# Patient Record
Sex: Female | Born: 1980 | Race: Black or African American | Hispanic: No | Marital: Married | Smoking: Never smoker
Health system: Southern US, Community
[De-identification: ages and names within clinical notes are randomized; demographics above are authoritative.]

## PROBLEM LIST (undated history)

## (undated) DIAGNOSIS — E049 Nontoxic goiter, unspecified: Secondary | ICD-10-CM

## (undated) DIAGNOSIS — Z8249 Family history of ischemic heart disease and other diseases of the circulatory system: Secondary | ICD-10-CM

## (undated) DIAGNOSIS — B379 Candidiasis, unspecified: Secondary | ICD-10-CM

## (undated) DIAGNOSIS — O411290 Chorioamnionitis, unspecified trimester, not applicable or unspecified: Secondary | ICD-10-CM

## (undated) DIAGNOSIS — Z862 Personal history of diseases of the blood and blood-forming organs and certain disorders involving the immune mechanism: Secondary | ICD-10-CM

## (undated) DIAGNOSIS — E010 Iodine-deficiency related diffuse (endemic) goiter: Secondary | ICD-10-CM

## (undated) HISTORY — DX: Family history of ischemic heart disease and other diseases of the circulatory system: Z82.49

## (undated) HISTORY — DX: Candidiasis, unspecified: B37.9

## (undated) HISTORY — DX: Nontoxic goiter, unspecified: E04.9

## (undated) HISTORY — DX: Personal history of diseases of the blood and blood-forming organs and certain disorders involving the immune mechanism: Z86.2

## (undated) HISTORY — DX: Iodine-deficiency related diffuse (endemic) goiter: E01.0

## (undated) HISTORY — DX: Chorioamnionitis, unspecified trimester, not applicable or unspecified: O41.1290

---

## 2005-08-26 ENCOUNTER — Other Ambulatory Visit: Admission: RE | Admit: 2005-08-26 | Discharge: 2005-08-26 | Payer: Self-pay | Admitting: Family Medicine

## 2007-07-11 ENCOUNTER — Other Ambulatory Visit: Admission: RE | Admit: 2007-07-11 | Discharge: 2007-07-11 | Payer: Self-pay | Admitting: Family Medicine

## 2008-04-08 ENCOUNTER — Inpatient Hospital Stay (HOSPITAL_COMMUNITY): Admission: AD | Admit: 2008-04-08 | Discharge: 2008-04-14 | Payer: Self-pay | Admitting: Obstetrics and Gynecology

## 2008-04-11 ENCOUNTER — Encounter (INDEPENDENT_AMBULATORY_CARE_PROVIDER_SITE_OTHER): Payer: Self-pay | Admitting: Obstetrics and Gynecology

## 2010-12-14 ENCOUNTER — Other Ambulatory Visit: Payer: Self-pay | Admitting: Obstetrics and Gynecology

## 2010-12-14 DIAGNOSIS — E079 Disorder of thyroid, unspecified: Secondary | ICD-10-CM

## 2010-12-16 ENCOUNTER — Ambulatory Visit
Admission: RE | Admit: 2010-12-16 | Discharge: 2010-12-16 | Disposition: A | Discharge status: Home / Self Care | Payer: BC Managed Care – PPO | Source: Ambulatory Visit | Attending: Obstetrics and Gynecology | Admitting: Obstetrics and Gynecology

## 2010-12-16 DIAGNOSIS — E079 Disorder of thyroid, unspecified: Secondary | ICD-10-CM

## 2011-01-19 NOTE — H&P (Signed)
Frederick, Pamela NO.:  [id number]   MEDICAL RECORD NO.:  [id number]        PATIENT TYPE:  WINP   LOCATION:                                FAC[facility]   PHYSICIAN:  Crist Fat. Rivard, M.D. DATE OF BIRTH:  [birth year]   DATE OF ADMISSION:  04/08/2008  DATE OF DISCHARGE:                              HISTORY & PHYSICAL   [name] is a 30 year old married black female, gravida at 40 weeks  and 4 days who presented as a work in at the office today for leaking of  fluid since 3 a.m.  On assessment, she was indeed ruptured. She was  having about two contractions an hour or less and good fetal movement.  No vaginal bleeding. She denied any PIH or urinary tract infection signs  or symptoms, nausea, vomiting or diarrhea, shortness of breath, fever or  cough or other complaints.   HISTORY:  1. Remarkable for small frame.  2. Platelets at her new OB 135.  3. GBS negative.  4. Questionable OP versus OT presentation.   PRENATAL LABS:  The patient's blood type is B+, Rh antibody screen  negative, sickle cell negative, hemoglobin on September 14, 2007 is 13,  hematocrit 35.9, platelets were 135, sickle cell trait negative, RPR  nonreactive, rubella titer immune, hepatitis surface antigen negative,  HIV non-reactive. She had a pap smear in 2008, was within normal limits,  but I cannot read on here exactly the month.  Recent chlamydia cultures  were negative.  As I noted, group beta strep is negative.  Her Glucola  was within normal limits.  She can have platelets redrawn at 27 and 2/7  weeks and they were 135, RPR was non-reactive.  Actually her one hour  GCT was elevated and she was sent for a 3-hour GCT.  Three hour GCT was  within normal limits.  Her fasting was 87 and her one hour was 170, her  two hour was 140 and her three hour was 130.  Platelets were 126 at 30  weeks and then she has one more platelet check and that was 126 around  33 weeks.   OBSTETRICAL  HISTORY:  The patient is a gravida.   PAST MEDICAL HISTORY:  The patient reports menarche at 30 years of age.  Has 28-30 day cycles. Flow is 5-7 days with some moderate cramping, but  no other abnormalities. Her last menstrual period was June 29, 2007  giving her an [facility] of April 04, 2008.  She reports normal childhood  illnesses including varicella and vaccines, occasional yeast infections.  She denies any surgical history.  Genetic history is unremarkable.   FAMILY HISTORY:  Mother with chronic hypertension, father tuberculosis,  mother is deceased of unknown cause. She has a brother who has a  nicotine and alcohol addiction.   SOCIAL HISTORY:  She is a married, African female. She has a Tree surgeon. The father of the baby's name is [name].  The patient has  had 16+ years of education and is a full time Runner, broadcasting/film/video. The father of the  baby has had 14 years  of education and is a Archivist.   ALLERGIES:  She denies medication or latex allergies or other  sensitivities.  She denies alcohol or illicit drug use, has had second  hand smoke exposure. She is taking a prenatal vitamin.   HISTORY OF PRESENT PREGNANCY:  She entered care at approximately 11  weeks. She came for her new OB interview about 8 and 5/7 weeks and that  was on December 23, then was transferred to her new OB workup on January  8 at approximately 11 weeks. I guess it says here her pap was done in  October of 2008 so it is coming up due post partum.  She declined first  trimester screen and plan was made for her to return in 1-2 weeks for an  ultrasound for dating.  So, she returned at approximately 13 and 1/7  weeks.  When she returned, she did not have any ultrasound scheduled for  the return OB and she declined rescheduling. She also declined Kleb  screen and first trimester screen.  She was prescribed Kenalog for a  rash on her left calf and was taking generic natel prenatal vitamin  prescription. She  returned at 18 weeks for her anatomy ultrasound  showing single intrauterine pregnancy within consistent dates. Cervical  length was 3.88 cm, regular rate and rhythm, posterior placenta, normal  fluid. All anatomy was seen and no anomalies observed at the time. The  patient did discuss taking childbirth classes. She is a Editor, commissioning.  She returned at 22 and 4/7 weeks and was doing well with the exception  of some back pain especially after a long day at work. She returned at  27 and 2/7 weeks and had her Glucola which was elevated, but three hour  was within normal limits. Plan was made for an ultrasound on her next  visit secondary to poor weight gain. She started out the pregnancy at  127 pounds and by 27 weeks was at 131.  She was without other complaints  and fetal counts were discussed. Platelets at that time were 136.  RPR  was non-reactive. She was then seen at 29 and 1/7 weeks and had an  ultrasound vertex  grade I placenta.  Estimated fetal weight 2 pounds  and 14 ounces which was 48th percentile, normal fluid, AFI 16.6,  cervical length was 4.35 cm.  At approximately 31 weeks, the patient did  have 2+ glucosuria and her blood sugar was 159, diet was reviewed.  She  then had her platelets checked that day which was May 28 and they were  126.  She was seen at 33 weeks and continued doing well and was without  complaints.  At around 35 weeks, still without any complaints. Her GBS  and GC, chlamydia cultures were done and her cervical examination was  closed 50% vertex and -3.  As noted, GBS and GC, chlamydia cultures was  negative. She did then voice at 36 weeks that she desired CNM care. On  Leopold's, fetus was in suspected OP presentation.  Discussed pelvic  tilts at that time. Pregnancy has continued to progress without any  other complications until she presented today with the complaints of the  leakage of fluid.  Today, her blood pressure was 130/76, she is without  edema.  She was measuring 39+ weeks at 40 and 4/[redacted] weeks gestation. Vertex  presentation, OP versus OT with fetal back on the patient's right side.  Fetal heart rate was in the 150s  and fetal movement was noted during her  examination.   PHYSICAL EXAMINATION:  GENERAL:  In no acute distress. Alert and  oriented x3 and pleasant. She is well dressed and she does not have a  slight accent.  HEENT: Is grossly intact and within normal limits.  CARDIOVASCULAR: Regular rate and rhythm. Without murmur.  LUNGS:  Clear to auscultation bilaterally.  ABDOMEN: Soft and nontender. It is gravid. No rebound, no guarding.  PELVIC:  Sterile spec examination with negative pooling, negative  Nitrazine, but positive fern. Her cervix is closed 50% and -2 station.  She had a scant white discharge.  Dr. Estanislado Pandy did also review the slide  and agreed with positive fern.  EXTREMITIES: Are within normal limits with no edema. No clonus. DTRs are  2+.   IMPRESSION:  1. Intrauterine pregnancy at 40 and 4/7 weeks.  2. Premature rupture of membranes.  3. Thrombocytopenia.  4. Group beta strep is negative.   PLAN:  1. Consulted with Dr. Estanislado Pandy and discussed expected management versus      induction of labor and then reviewed options, both options, risks,      benefits and alternatives with the patient and the patient desires      to proceed with expected management at present. However, Dr. Estanislado Pandy      did want the patient to be directly admitted for some fetal      monitoring.  2. Admit to birthing suites with routine L&D orders with Dr. Estanislado Pandy as      attending physician.  3. At present, will do expected management and will proceed with      pitocin IV per protocol p.r.n. for induction or augmentation if      spontaneous labor ensues. Also, will rediscuss with Dr. Estanislado Pandy if      Cervidil could be an option for some ripening.      Candice [place], [place]      ______________________________  Crist Fat  Rivard, M.D.    CHS/MEDQ  D:  04/08/2008  T:  04/08/2008  Job:  [id number]

## 2011-01-19 NOTE — Op Note (Signed)
[name], [name]            ACCOUNT NO.:  [id number]   MEDICAL RECORD NO.:  [id number]          PATIENT TYPE:  INP   LOCATION:  9125                          [facility]ILITY:  [facility]   PHYSICIAN:  Crist Fat. Rivard, M.D. DATE OF BIRTH:  26-Jul-1981   DATE OF PROCEDURE:  04/11/2008  DATE OF DISCHARGE:                               OPERATIVE REPORT   PREOPERATIVE DIAGNOSIS:  Intrauterine pregnancy at 41 weeks with  chorioamnionitis and failure to progress.   POSTOPERATIVE DIAGNOSIS:  Intrauterine pregnancy at 41 weeks with  chorioamnionitis and failure to progress.   ANESTHESIA:  Epidural, Donald T. Pamalee Leyden, MD   PROCEDURE:  Primary low-transverse cesarean section.   SURGEON:  Crist Fat. Rivard, MD   ASSISTANT:  Larna Daughters, Certified Nurse Midwife.   ESTIMATED BLOOD LOSS:  700 mL.   PROCEDURE:  After being informed of the planned procedure with possible  complications including bleeding, infection, injury to bowels, bladder,  or ureters.  Informed consent was obtained.  The patient was taken to OR  #1 and preexisting epidural anesthesia was reinforced.  She was placed  in the dorsal decubitus position, pelvis tilted to the left, prepped and  draped in a sterile fashion.  A Foley catheter was already in her  bladder.   After assessing adequate level of anesthesia, we infiltrate the  suprapubic area with 20 mL of Marcaine 0.25 and perform a Pfannenstiel  incision, which was brought down sharply to the fascia.  The fascia was  incised in a low transverse fashion.  Linea alba was dissected and  peritoneum was entered bluntly.  Visceral peritoneum was entered in a  low transverse fashion allowing [place] to safely retract bladder by  developing a bladder flap.  Alexis retractor was inserted easily.  We  then performed the uterine incision in a low-transverse fashion first  with knife and then extended bluntly.  Amniotic fluid was clear and  scars.  We assist the birth of a female  infant at 1:35 a.m. in ROT  presentation.  Mouth and nose were suctioned.  Nuchal cord x1 was  reduced.  Baby was delivered.  Cord was clamped with 2 Kelly clamps and  sectioned and the baby was given to the neonatologist present in the  room.   We await spontaneous evacuation of the placenta, which was complete.  Cord has three vessel and uterine revision was negative.  We do note a  small uterine atony after evacuation of the placenta, which responds  well to a perfusion of Pitocin 40 units and 1000 mL as well as 1 dose of  Methergine IM 0.2 mg.   We then proceed with closure of the myometrium in 2 layers, first with a  running lock suture of 0 Vicryl and then with a Lembert suture of 0  Vicryl imbricating the first one.  Hemostasis was completed on the right  angle with a figure-of-eight stitch of 0 Vicryl.  Both paracolic gutters  were cleaned.  Both tubes and ovaries assessed normal and the pelvis was  profusely irrigated with warm saline to reveal a satisfactory  hemostasis.  Under fascia, hemostasis was completed with cautery and the fascia was  closed with two running sutures of 1 Vicryl meeting midline.  Incision  was irrigated with warm saline.  Hemostasis was completed with cautery  and the skin was closed with a subcuticular suture of 3-0 Monocryl and  Steri-Strips.   Instrument and sponge count was complete x2.  Estimated blood loss was  700 mL.  The procedure was very well tolerated by the patient who was  taken to recovery room in a well and stable condition.   Little boy unnamed at this time, was born at 1:35 a.m., received an  Apgar of 8 at 1 minute and 9 at 5 minutes and weighed 6 pounds 5 ounces.   SPECIMEN:  Placenta sent to cord blood donation and then to pathology  for chorioamnionitis.      Crist Fat Rivard, M.D.  Electronically Signed     SAR/MEDQ  D:  04/11/2008  T:  04/11/2008  Job:  [id number]

## 2011-01-19 NOTE — Discharge Summary (Signed)
NAME:  [name], [name]            ACCOUNT NO.:  [id number]   MEDICAL RECORD NO.:  [id number]          PATIENT TYPE:  INP   LOCATION:  9125                          [facility]   PHYSICIAN:  Naima A. Dillard, M.D. DATE OF BIRTH:  30-Mar-1981   DATE OF ADMISSION:  04/08/2008  DATE OF DISCHARGE:  04/14/2008                               DISCHARGE SUMMARY   ADMITTING PHYSICIAN:  Osborn Coho, MD   ADMISSION DIAGNOSES:  1. Intrauterine pregnancy at 40 and 4/7 weeks.  2. Premature rupture of  membranes.  3. Thrombocytopenia.  4. Group B Streptococcus negative.   DISCHARGE DIAGNOSES:  1. Intrauterine pregnancy at 40 and 4/7 weeks.  2. Premature rupture of  membranes.  3. Thrombocytopenia.  4. Group B Streptococcus negative.  5. Chorioamnionitis.  6. Failure to progress.   HOSPITAL PROCEDURES:  1. Electronic fetal monitoring.  2. Pitocin augmentation of labor.  3. Internal fetal monitoring.  4. Amniotomy of forebag.  5. Epidural anesthesia.  6. Primary low-transverse cesarean section.   HOSPITAL COURSE:  The patient was admitted with ruptured membranes.  With her cervix closed and 50%, she elected to proceed expectantly, but  the next day, decided to proceed with Pitocin augmentation of labor.  She took a long time to achieve adequate contractions and continued on  Pitocin through the next day.  IUPC was inserted.  Amniotomy of forebag  was done.  She progressed as far as 4 to 5 cm, but then developed fever,  fetal tachycardia, and maternal tachycardia and in light of those  situations with the failure to progress, a decision was made to proceed  with primary low-transverse cesarean section under epidural anesthesia  by Dr. Estanislado Pandy.  She was delivered of a female infant weighing 6 pounds 5  ounces, Apgars 8 and 9.  She went to recovery and then to mother baby  unit, where she received routine care.  On postop day #1, she was doing  well, breast-feeding, ambulating, and  beginning to tolerate a diet.  On  postop day #2, she continued to do well.  Baby lost some weight, so she  began pumping her breasts in addition for breast-feeding.  On postop day  #3, she was ready to go home.  Baby and mother were both doing well.  She was breast-feeding well.  Vital signs were stable.  She was  afebrile.  Chest was clear.  Heart was regular rate and rhythm.  Abdomen  soft and appropriately tender.  Incision was clean, dry, and intact.  Lochia was normal.  Extremities within normal limits.  She was deemed to  have received full benefit of her hospital stay and was discharged home.   DISCHARGE MEDICATIONS:  Motrin 600 mg p.o. q.6 h. p.r.n., Darvocet 1 to  2 p.o. q.4 h. p.r.n.   DISCHARGE LABS:  White blood cell count 6.1, hemoglobin 9.7, and  platelets 92,000.   DISCHARGE INSTRUCTIONS:  Per [facility] handout.  Discharge followup in 6 weeks  or p.r.n.   CONDITION ON DISCHARGE:  Good.      Marie L. Williams, C.N.M.  ______________________________  Pierre Bali Normand Sloop, M.D.    MLW/MEDQ  D:  04/14/2008  T:  04/14/2008  Job:  [id number]

## 2011-06-04 LAB — DIFFERENTIAL
Basophils Relative: 0
Eosinophils Absolute: 0.2
Eosinophils Relative: 4
Lymphs Abs: 1
Monocytes Absolute: 0.6
Monocytes Relative: 10

## 2011-06-04 LAB — CBC
HCT: 28.4 — ABNORMAL LOW
HCT: 37.7
HCT: 39
Hemoglobin: 12.9
Hemoglobin: 13.2
MCHC: 33.8
MCHC: 34
MCV: 94.2
MCV: 94.7
MCV: 94.9
RBC: 2.99 — ABNORMAL LOW
RBC: 4
RBC: 4.11
RDW: 12.8
WBC: 5.1
WBC: 6.1

## 2011-11-24 DIAGNOSIS — E010 Iodine-deficiency related diffuse (endemic) goiter: Secondary | ICD-10-CM | POA: Insufficient documentation

## 2011-12-17 ENCOUNTER — Ambulatory Visit: Payer: Self-pay | Admitting: Obstetrics and Gynecology

## 2012-01-12 ENCOUNTER — Ambulatory Visit: Payer: Self-pay | Admitting: Obstetrics and Gynecology

## 2012-02-17 ENCOUNTER — Ambulatory Visit: Payer: Self-pay | Admitting: Obstetrics and Gynecology

## 2012-03-13 ENCOUNTER — Ambulatory Visit (INDEPENDENT_AMBULATORY_CARE_PROVIDER_SITE_OTHER): Payer: BC Managed Care – PPO | Admitting: Obstetrics and Gynecology

## 2012-03-13 ENCOUNTER — Encounter: Payer: Self-pay | Admitting: Obstetrics and Gynecology

## 2012-03-13 VITALS — BP 98/68 | Temp 98.7°F | Resp 16 | Ht 60.0 in | Wt 145.0 lb

## 2012-03-13 DIAGNOSIS — Z0000 Encounter for general adult medical examination without abnormal findings: Secondary | ICD-10-CM

## 2012-03-13 NOTE — Progress Notes (Signed)
Last Pap: 12/14/2010 WNL: Yes Regular Periods:yes Contraception: Yes  Monthly Breast exam:yes Tetanus<17yrs:yes Nl.Bladder Function:yes Daily BMs:yes Healthy Diet:yes Calcium:yes Mammogram:no Date of Mammogram: N/A Exercise:yes Have often Exercise: 4 times weekly/walks Seatbelt: yes Abuse at home: no Stressful work:no Sigmoid-colonoscopy: Never had Bone Density: No PCP: Dr. Hyacinth Meeker Change in PMH: None  Change in [facility]: None  *Pt without complaints today *[place] a pharmacy [place] BP 98/68  Temp 98.7 F (37.1 C)  Resp 16  Ht 5' (1.524 m)  Wt 145 lb (65.772 kg)  BMI 28.32 kg/m2  LMP 03/03/2012 Pt without complaints Physical Examination: General appearance - alert, well appearing, and in no distress Mental status - normal mood, behavior, speech, dress, motor activity, and thought processes Neck - supple, no significant adenopathy, thyroid exam: thyroid is normal in size without nodules or tenderness Chest - clear to auscultation, no wheezes, rales or rhonchi, symmetric air entry Heart - normal rate and regular rhythm Abdomen - soft, nontender, nondistended, no masses or organomegaly Breasts - breasts appear normal, no suspicious masses, no skin or nipple changes or axillary nodes Pelvic - normal external genitalia, vulva, vagina, cervix, uterus and adnexa Rectal - normal rectal, no masses, rectal exam not indicated Back exam - full range of motion, no tenderness, palpable spasm or pain on motion Neurological - alert, oriented, normal speech, no focal findings or movement disorder noted Musculoskeletal - no joint tenderness, deformity or swelling Extremities - no edema, redness or tenderness in the calves or thighs Skin - normal coloration and turgor, no rashes, no suspicious skin lesions noted Routine exam Pap sent yes Mammogram due no pt using ocps for [facility].  she gets them [place]

## 2012-03-13 NOTE — Addendum Note (Signed)
Addended by: Rolla Plate on: 03/13/2012 03:56 PM   Modules accepted: Orders

## 2013-03-22 ENCOUNTER — Other Ambulatory Visit: Payer: Self-pay | Admitting: Obstetrics and Gynecology

## 2013-03-22 DIAGNOSIS — N63 Unspecified lump in unspecified breast: Secondary | ICD-10-CM

## 2013-04-18 ENCOUNTER — Ambulatory Visit
Admission: RE | Admit: 2013-04-18 | Discharge: 2013-04-18 | Disposition: A | Discharge status: Home / Self Care | Payer: Self-pay | Source: Ambulatory Visit | Attending: Obstetrics and Gynecology | Admitting: Obstetrics and Gynecology

## 2013-04-18 ENCOUNTER — Other Ambulatory Visit: Payer: Self-pay | Admitting: Obstetrics and Gynecology

## 2013-04-18 DIAGNOSIS — N63 Unspecified lump in unspecified breast: Secondary | ICD-10-CM

## 2013-04-25 ENCOUNTER — Other Ambulatory Visit: Payer: Self-pay | Admitting: Obstetrics and Gynecology

## 2013-04-25 DIAGNOSIS — N63 Unspecified lump in unspecified breast: Secondary | ICD-10-CM

## 2013-04-30 ENCOUNTER — Ambulatory Visit
Admission: RE | Admit: 2013-04-30 | Discharge: 2013-04-30 | Disposition: A | Discharge status: Home / Self Care | Payer: BC Managed Care – PPO | Source: Ambulatory Visit | Attending: Obstetrics and Gynecology | Admitting: Obstetrics and Gynecology

## 2013-04-30 ENCOUNTER — Other Ambulatory Visit: Payer: Self-pay | Admitting: Obstetrics and Gynecology

## 2013-04-30 DIAGNOSIS — N63 Unspecified lump in unspecified breast: Secondary | ICD-10-CM

## 2013-11-28 ENCOUNTER — Encounter: Payer: Self-pay | Admitting: *Deleted

## 2014-02-14 ENCOUNTER — Inpatient Hospital Stay (HOSPITAL_COMMUNITY)
Admission: AD | Admit: 2014-02-14 | Discharge: 2014-02-15 | Disposition: A | Discharge status: Home / Self Care | Payer: BC Managed Care – PPO | Source: Ambulatory Visit | Attending: Obstetrics and Gynecology | Admitting: Obstetrics and Gynecology

## 2014-02-14 DIAGNOSIS — Z8249 Family history of ischemic heart disease and other diseases of the circulatory system: Secondary | ICD-10-CM | POA: Insufficient documentation

## 2014-02-14 DIAGNOSIS — O479 False labor, unspecified: Secondary | ICD-10-CM | POA: Insufficient documentation

## 2014-02-14 DIAGNOSIS — O34219 Maternal care for unspecified type scar from previous cesarean delivery: Secondary | ICD-10-CM | POA: Insufficient documentation

## 2014-02-15 ENCOUNTER — Encounter (HOSPITAL_COMMUNITY): Payer: Self-pay | Admitting: *Deleted

## 2014-02-15 MED ORDER — ZOLPIDEM TARTRATE 5 MG PO TABS
10.0000 mg | ORAL_TABLET | Freq: Once | ORAL | Status: AC
  Administered 2014-02-15: 10 mg via ORAL
  Filled 2014-02-15: qty 2

## 2014-02-15 MED ORDER — ZOLPIDEM TARTRATE 10 MG PO TABS: 10.0000 mg | ORAL_TABLET | Freq: Once | ORAL | Status: DC

## 2014-02-15 NOTE — MAU Note (Signed)
Pt reports regular uc's for the past 3 hours. Pt states that the contractions are about every 5 min when she is up walking, every 10 min when she is resting. Deneis bleeding or leaking. +FM

## 2014-02-15 NOTE — MAU Provider Note (Signed)
  History     CSN: [id number]  Arrival date and time: 02/14/14 2358   None     Chief Complaint  Patient presents with  . Contractions   HPI Comments: Pt is a 33yo G2P1 at 6850w3d arrives after calling CNM w report of more regular painful ctx in the last few hours. Denies any VB or LOF, +FM. Desires VBAC     Past Medical History  Diagnosis Date  . Yeast infection   . H/O thrombocytopenia   . Chorioamnionitis   . Enlarged thyroid   . Thyromegaly   . FH: HTN (hypertension)     Past Surgical History  Procedure Laterality Date  . Cesarean section      Family History  Problem Relation Age of Onset  . Hypertension Mother     History  Substance Use Topics  . Smoking status: Never Smoker   . Smokeless tobacco: Never Used  . Alcohol Use: No    Allergies: No Known Allergies  Prescriptions prior to admission  Medication Sig Dispense Refill  . Multiple Vitamins-Minerals (MULTIVITAMIN WITH MINERALS) tablet Take 1 tablet by mouth daily.      Marland Kitchen. levonorgestrel-ethinyl estradiol (LEVORA 0.15/30, 28,) 0.15-30 MG-MCG tablet Take 1 tablet by mouth daily.        Review of Systems  All other systems reviewed and are negative.  Physical Exam   Blood pressure 136/71, pulse 89, temperature 98.4 F (36.9 C), temperature source Oral, resp. rate 18.  Physical Exam  Nursing note and vitals reviewed. Constitutional: She is oriented to person, place, and time. She appears well-developed and well-nourished.  HENT:  Head: Normocephalic.  Eyes: Pupils are equal, round, and reactive to light.  Neck: Normal range of motion.  Cardiovascular: Normal rate.   Respiratory: Effort normal.  GI: Soft.  Genitourinary: Vagina normal.  Musculoskeletal: Normal range of motion.  Neurological: She is alert and oriented to person, place, and time. She has normal reflexes.  Skin: Skin is warm and dry.  Psychiatric: She has a normal mood and affect. Her behavior is normal.    MAU Course   Procedures    Assessment and Plan  IUP at 2250w3d FHR cat 1 irreg toco Closed cervix Desires VBAC  Not in labor ambien 10mg  PO given before discharge and RX given rv'd [facility]KC and labor sx's,  F/u routine as scheduled in office Tues or before w labor sx's, ROM, dec FM or VB dc'd home stable cond  Airabella Barley M 02/15/2014, 1:32 AM

## 2014-02-15 NOTE — Discharge Instructions (Signed)
Braxton Hicks Contractions Pregnancy is commonly associated with contractions of the uterus throughout the pregnancy. Towards the end of pregnancy (32 to 34 weeks), these contractions [facility] Willa Rough) can develop more often and may become more forceful. This is not true labor because these contractions do not result in opening (dilatation) and thinning of the cervix. They are sometimes difficult to tell apart from true labor because these contractions can be forceful and people have different pain tolerances. You should not feel embarrassed if you go to the hospital with false labor. Sometimes, the only way to tell if you are in true labor is for your caregiver to follow the changes in the cervix. How to tell the difference between true and false labor:  False labor.  The contractions of false labor are usually shorter, irregular and not as hard as those of true labor.  They are often felt in the front of the lower abdomen and in the groin.  They may leave with walking around or changing positions while lying down.  They get weaker and are shorter lasting as time goes on.  These contractions are usually irregular.  They do not usually become progressively stronger, regular and closer together as with true labor.  True labor.  Contractions in true labor last 30 to 70 seconds, become very regular, usually become more intense, and increase in frequency.  They do not go away with walking.  The discomfort is usually felt in the top of the uterus and spreads to the lower abdomen and low back.  True labor can be determined by your caregiver with an exam. This will show that the cervix is dilating and getting thinner. If there are no prenatal problems or other health problems associated with the pregnancy, it is completely safe to be sent home with false labor and await the onset of true labor. HOME CARE INSTRUCTIONS   Keep up with your usual exercises and instructions.  Take medications as  directed.  Keep your regular prenatal appointment.  Eat and drink lightly if you think you are going into labor.  If BH contractions are making you uncomfortable:  Change your activity position from lying down or resting to walking/walking to resting.  Sit and rest in a tub of warm water.  Drink 2 to 3 glasses of water. Dehydration may cause B-H contractions.  Do slow and deep breathing several times an hour. SEEK IMMEDIATE MEDICAL CARE IF:   Your contractions continue to become stronger, more regular, and closer together.  You have a gushing, burst or leaking of fluid from the vagina.  An oral temperature above 102 F (38.9 C) develops.  You have passage of blood-tinged mucus.  You develop vaginal bleeding.  You develop continuous belly (abdominal) pain.  You have low back pain that you never had before.  You feel the baby's head pushing down causing pelvic pressure.  The baby is not moving as much as it used to. Document Released: 08/23/2005 Document Revised: 11/15/2011 Document Reviewed: 06/04/2013 [facility] Patient Information 2014 [place], [place].  Vaginal Birth After Cesarean Delivery Vaginal birth after cesarean delivery (VBAC) is giving birth vaginally after previously delivering a baby by a cesarean. In the past, if a woman had a cesarean delivery, all births afterwards would be done by cesarean delivery. This is no longer true. It can be safe for the mother to try a vaginal delivery after having a cesarean delivery.  It is important to discuss VBAC with your health care provider early in the  pregnancy so you can understand the risks, benefits, and options. It will give you time to decide what is best in your particular case. The final decision about whether to have a VBAC or repeat cesarean delivery should be between you and your health care provider. Any changes in your health or your baby's health during your pregnancy may make it necessary to change your initial  decision about VBAC.  WOMEN WHO PLAN TO HAVE A VBAC SHOULD CHECK WITH THEIR HEALTH CARE PROVIDER TO BE SURE THAT:  The previous cesarean delivery was done with a low transverse uterine cut (incision) (not a vertical classical incision).   The birth canal is big enough for the baby.   There were no other operations on the uterus.   An electronic fetal monitor (EFM) will be on at all times during labor.   An operating room will be available and ready in case an emergency cesarean delivery is needed.   A health care provider and surgical nursing staff will be available at all times during labor to be ready to do an emergency delivery cesarean if necessary.   An anesthesiologist will be present in case an emergency cesarean delivery is needed.   The nursery is prepared and has adequate personnel and necessary equipment available to care for the baby in case of an emergency cesarean delivery. BENEFITS OF VBAC  Shorter stay in the hospital.   Avoidance of risks associated with cesarean delivery, such as:  Surgical complications, such as opening of the incision or hernia in the incision.  Injury to other organs.  Fever. This can occur if an infection develops after surgery. It can also occur as a reaction to the medicine given to make you numb during the surgery.  Less blood loss and need for blood transfusions.  Lower risk of blood clots and infection.  Shorter recovery.   Decreased risk for having to remove the uterus (hysterectomy).   Decreased risk for the placenta to completely or partially cover the opening of the uterus (placenta previa) with a future pregnancy.   Decrease risk in future labor and delivery. RISKS OF A VBAC  Tearing (rupture) of the uterus. This is occurs in less than 1% of VBACs. The risk of this happening is higher if:  Steps are taken to begin the labor process (induce labor) or stimulate or strengthen contractions (augment labor).    Medicine is used to soften (ripen) the cervix.  Having to remove the uterus (hysterectomy) if it ruptures. VBAC SHOULD NOT BE DONE IF:  The previous cesarean delivery was done with a vertical (classical) or T-shaped incision or you do not know what kind of incision was made.   You had a ruptured uterus.   You have had certain types of surgery on your uterus, such as removal of uterine fibroids. Ask your health care provider about other types of surgeries that prevent you from having a VBAC.  You have certain medical or childbirth (obstetrical) problems.   There are problems with the baby.   You have had two previous cesarean deliveries and no vaginal deliveries. OTHER FACTS TO KNOW ABOUT VBAC:  It is safe to have an epidural anesthetic with VBAC.   It is safe to turn the baby from a breech position (attempt an external cephalic version).   It is safe to try a VBAC with twins.   VBAC may not be successful if your baby weights 8.8 lb (4 kg) or more. However, weight predictions are not  always accurate and should not be used alone to decide if VBAC is right for you.  There is an increased failure rate if the time between the cesarean delivery and VBAC is less than 19 months.   Your health care provider may advise against a VBAC if you have preeclampsia (high blood pressure, protein in the urine, and swelling of face and extremities).   VBAC is often successful if you previously gave birth vaginally.   VBAC is often successful when the labor starts spontaneously before the due date.   Delivering a baby through a VBAC is similar to having a normal spontaneous vaginal delivery. Document Released: 02/13/2007 Document Revised: 06/13/2013 Document Reviewed: 03/22/2013 [facility] Patient Information 2014 [place].  Fetal Movement Counts Patient Name: __________________________________________________ Patient Due Date: ____________________ Performing a fetal movement  count is highly recommended in high-risk pregnancies, but it is good for every pregnant woman to do. Your caregiver may ask you to start counting fetal movements at 28 weeks of the pregnancy. Fetal movements often increase:  After eating a full meal.  After physical activity.  After eating or drinking something sweet or cold.  At rest. Pay attention to when you feel the baby is most active. This will help you notice a pattern of your baby's sleep and wake cycles and what factors contribute to an increase in fetal movement. It is important to perform a fetal movement count at the same time each day when your baby is normally most active.  HOW TO COUNT FETAL MOVEMENTS 1. Find a quiet and comfortable area to sit or lie down on your left side. Lying on your left side provides the best blood and oxygen circulation to your baby. 2. Write down the day and time on a sheet of paper or in a journal. 3. Start counting kicks, flutters, swishes, rolls, or jabs in a 2 hour period. You should feel at least 10 movements within 2 hours. 4. If you do not feel 10 movements in 2 hours, wait 2 3 hours and count again. Look for a change in the pattern or not enough counts in 2 hours. SEEK MEDICAL CARE IF:  You feel less than 10 counts in 2 hours, tried twice.  There is no movement in over an hour.  The pattern is changing or taking longer each day to reach 10 counts in 2 hours.  You feel the baby is not moving as he or she usually does. Date: ____________ Movements: ____________ Start time: ____________ Pamela MartinFinish time: ____________  Date: ____________ Movements: ____________ Start time: ____________ Pamela MartinFinish time: ____________ Date: ____________ Movements: ____________ Start time: ____________ Pamela MartinFinish time: ____________ Date: ____________ Movements: ____________ Start time: ____________ Pamela MartinFinish time: ____________ Date: ____________ Movements: ____________ Start time: ____________ Pamela MartinFinish time: ____________ Date:  ____________ Movements: ____________ Start time: ____________ Pamela MartinFinish time: ____________ Date: ____________ Movements: ____________ Start time: ____________ Pamela MartinFinish time: ____________ Date: ____________ Movements: ____________ Start time: ____________ Pamela MartinFinish time: ____________  Date: ____________ Movements: ____________ Start time: ____________ Pamela MartinFinish time: ____________ Date: ____________ Movements: ____________ Start time: ____________ Pamela MartinFinish time: ____________ Date: ____________ Movements: ____________ Start time: ____________ Pamela MartinFinish time: ____________ Date: ____________ Movements: ____________ Start time: ____________ Pamela MartinFinish time: ____________ Date: ____________ Movements: ____________ Start time: ____________ Pamela MartinFinish time: ____________ Date: ____________ Movements: ____________ Start time: ____________ Pamela MartinFinish time: ____________ Date: ____________ Movements: ____________ Start time: ____________ Pamela MartinFinish time: ____________  Date: ____________ Movements: ____________ Start time: ____________ Pamela MartinFinish time: ____________ Date: ____________ Movements: ____________ Start time: ____________ Pamela MartinFinish time: ____________ Date: ____________ Movements: ____________ Start  time: ____________ [name] time: ____________ Date: ____________ Movements: ____________ Start time: ____________ [name] time: ____________ Date: ____________ Movements: ____________ Start time: ____________ [name] time: ____________ Date: ____________ Movements: ____________ Start time: ____________ [name] time: ____________ Date: ____________ Movements: ____________ Start time: ____________ [name] time: ____________  Date: ____________ Movements: ____________ Start time: ____________ [name] time: ____________ Date: ____________ Movements: ____________ Start time: ____________ [name] time: ____________ Date: ____________ Movements: ____________ Start time: ____________ [name] time: ____________ Date: ____________ Movements: ____________ Start  time: ____________ [name] time: ____________ Date: ____________ Movements: ____________ Start time: ____________ [name] time: ____________ Date: ____________ Movements: ____________ Start time: ____________ [name] time: ____________ Date: ____________ Movements: ____________ Start time: ____________ [name] time: ____________  Date: ____________ Movements: ____________ Start time: ____________ [name] time: ____________ Date: ____________ Movements: ____________ Start time: ____________ [name] time: ____________ Date: ____________ Movements: ____________ Start time: ____________ [name] time: ____________ Date: ____________ Movements: ____________ Start time: ____________ [name] time: ____________ Date: ____________ Movements: ____________ Start time: ____________ [name] time: ____________ Date: ____________ Movements: ____________ Start time: ____________ [name] time: ____________ Date: ____________ Movements: ____________ Start time: ____________ [name] time: ____________  Date: ____________ Movements: ____________ Start time: ____________ [name] time: ____________ Date: ____________ Movements: ____________ Start time: ____________ [name] time: ____________ Date: ____________ Movements: ____________ Start time: ____________ [name] time: ____________ Date: ____________ Movements: ____________ Start time: ____________ [name] time: ____________ Date: ____________ Movements: ____________ Start time: ____________ [name] time: ____________ Date: ____________ Movements: ____________ Start time: ____________ [name] time: ____________ Date: ____________ Movements: ____________ Start time: ____________ [name] time: ____________  Date: ____________ Movements: ____________ Start time: ____________ [name] time: ____________ Date: ____________ Movements: ____________ Start time: ____________ [name] time: ____________ Date: ____________ Movements: ____________ Start time: ____________ [name] time:  ____________ Date: ____________ Movements: ____________ Start time: ____________ [name] time: ____________ Date: ____________ Movements: ____________ Start time: ____________ [name] time: ____________ Date: ____________ Movements: ____________ Start time: ____________ [name] time: ____________ Date: ____________ Movements: ____________ Start time: ____________ [name] time: ____________  Date: ____________ Movements: ____________ Start time: ____________ [name] time: ____________ Date: ____________ Movements: ____________ Start time: ____________ [name] time: ____________ Date: ____________ Movements: ____________ Start time: ____________ [name] time: ____________ Date: ____________ Movements: ____________ Start time: ____________ [name] time: ____________ Date: ____________ Movements: ____________ Start time: ____________ [name] time: ____________ Date: ____________ Movements: ____________ Start time: ____________ [name] time: ____________ Document Released: 09/22/2006 Document Revised: 08/09/2012 Document Reviewed: 06/19/2012 [employer] Patient Information 2014 [employer].

## 2014-02-16 ENCOUNTER — Encounter (HOSPITAL_COMMUNITY)
Admission: AD | Disposition: A | Discharge status: Home / Self Care | Payer: Self-pay | Source: Ambulatory Visit | Attending: Obstetrics and Gynecology

## 2014-02-16 ENCOUNTER — Inpatient Hospital Stay (HOSPITAL_COMMUNITY)
Admission: AD | Admit: 2014-02-16 | Discharge: 2014-02-18 | DRG: 765 | Disposition: A | Discharge status: Home / Self Care | Payer: BC Managed Care – PPO | Source: Ambulatory Visit | Attending: Obstetrics and Gynecology | Admitting: Obstetrics and Gynecology

## 2014-02-16 ENCOUNTER — Encounter (HOSPITAL_COMMUNITY): Payer: Self-pay | Admitting: *Deleted

## 2014-02-16 ENCOUNTER — Inpatient Hospital Stay (HOSPITAL_COMMUNITY): Payer: BC Managed Care – PPO

## 2014-02-16 ENCOUNTER — Encounter (HOSPITAL_COMMUNITY): Payer: BC Managed Care – PPO

## 2014-02-16 DIAGNOSIS — O9912 Other diseases of the blood and blood-forming organs and certain disorders involving the immune mechanism complicating childbirth: Secondary | ICD-10-CM

## 2014-02-16 DIAGNOSIS — O9989 Other specified diseases and conditions complicating pregnancy, childbirth and the puerperium: Secondary | ICD-10-CM

## 2014-02-16 DIAGNOSIS — E0789 Other specified disorders of thyroid: Secondary | ICD-10-CM | POA: Diagnosis present

## 2014-02-16 DIAGNOSIS — Z8249 Family history of ischemic heart disease and other diseases of the circulatory system: Secondary | ICD-10-CM

## 2014-02-16 DIAGNOSIS — Z22330 Carrier of Group B streptococcus: Secondary | ICD-10-CM

## 2014-02-16 DIAGNOSIS — O99284 Endocrine, nutritional and metabolic diseases complicating childbirth: Secondary | ICD-10-CM

## 2014-02-16 DIAGNOSIS — O09529 Supervision of elderly multigravida, unspecified trimester: Secondary | ICD-10-CM | POA: Diagnosis present

## 2014-02-16 DIAGNOSIS — E010 Iodine-deficiency related diffuse (endemic) goiter: Secondary | ICD-10-CM

## 2014-02-16 DIAGNOSIS — O99892 Other specified diseases and conditions complicating childbirth: Secondary | ICD-10-CM | POA: Diagnosis present

## 2014-02-16 DIAGNOSIS — D696 Thrombocytopenia, unspecified: Secondary | ICD-10-CM

## 2014-02-16 DIAGNOSIS — N736 Female pelvic peritoneal adhesions (postinfective): Secondary | ICD-10-CM | POA: Diagnosis present

## 2014-02-16 DIAGNOSIS — R6252 Short stature (child): Secondary | ICD-10-CM

## 2014-02-16 DIAGNOSIS — O34219 Maternal care for unspecified type scar from previous cesarean delivery: Principal | ICD-10-CM

## 2014-02-16 DIAGNOSIS — D689 Coagulation defect, unspecified: Secondary | ICD-10-CM | POA: Diagnosis present

## 2014-02-16 DIAGNOSIS — E079 Disorder of thyroid, unspecified: Secondary | ICD-10-CM | POA: Diagnosis present

## 2014-02-16 DIAGNOSIS — Z98891 History of uterine scar from previous surgery: Secondary | ICD-10-CM

## 2014-02-16 DIAGNOSIS — O99820 Streptococcus B carrier state complicating pregnancy: Secondary | ICD-10-CM

## 2014-02-16 DIAGNOSIS — O0930 Supervision of pregnancy with insufficient antenatal care, unspecified trimester: Secondary | ICD-10-CM

## 2014-02-16 LAB — CBC
HCT: 33.7 % — ABNORMAL LOW (ref 36.0–46.0)
HEMATOCRIT: 33.1 % — AB (ref 36.0–46.0)
HEMOGLOBIN: 11.5 g/dL — AB (ref 12.0–15.0)
Hemoglobin: 12 g/dL (ref 12.0–15.0)
MCH: 32.7 pg (ref 26.0–34.0)
MCH: 31.9 pg (ref 26.0–34.0)
MCHC: 35.6 g/dL (ref 30.0–36.0)
MCHC: 34.7 g/dL (ref 30.0–36.0)
MCV: 91.8 fL (ref 78.0–100.0)
MCV: 91.9 fL (ref 78.0–100.0)
PLATELETS: 125 10*3/uL — AB (ref 150–400)
Platelets: 137 10*3/uL — ABNORMAL LOW (ref 150–400)
RBC: 3.67 MIL/uL — ABNORMAL LOW (ref 3.87–5.11)
RBC: 3.6 MIL/uL — ABNORMAL LOW (ref 3.87–5.11)
RDW: 13 % (ref 11.5–15.5)
RDW: 12.9 % (ref 11.5–15.5)
WBC: 7.1 10*3/uL (ref 4.0–10.5)
WBC: 9.8 10*3/uL (ref 4.0–10.5)

## 2014-02-16 LAB — RPR

## 2014-02-16 SURGERY — Surgical Case
Anesthesia: Spinal | Site: Abdomen

## 2014-02-16 MED ORDER — METOCLOPRAMIDE HCL 5 MG/ML IJ SOLN: 10.0000 mg | Freq: Three times a day (TID) | INTRAMUSCULAR | Status: DC | PRN

## 2014-02-16 MED ORDER — METHYLERGONOVINE MALEATE 0.2 MG PO TABS: 0.2000 mg | ORAL_TABLET | ORAL | Status: DC | PRN

## 2014-02-16 MED ORDER — METHYLERGONOVINE MALEATE 0.2 MG/ML IJ SOLN: 0.2000 mg | INTRAMUSCULAR | Status: DC | PRN

## 2014-02-16 MED ORDER — SCOPOLAMINE 1 MG/3DAYS TD PT72
1.0000 | MEDICATED_PATCH | Freq: Once | TRANSDERMAL | Status: DC
  Administered 2014-02-16: 1.5 mg via TRANSDERMAL

## 2014-02-16 MED ORDER — SODIUM CHLORIDE 0.9 % IJ SOLN: 3.0000 mL | INTRAMUSCULAR | Status: DC | PRN

## 2014-02-16 MED ORDER — FERROUS SULFATE 325 (65 FE) MG PO TABS
325.0000 mg | ORAL_TABLET | Freq: Two times a day (BID) | ORAL | Status: DC
  Administered 2014-02-16 – 2014-02-18 (×4): 325 mg via ORAL
  Filled 2014-02-16 (×4): qty 1

## 2014-02-16 MED ORDER — MEPERIDINE HCL 25 MG/ML IJ SOLN: 6.2500 mg | INTRAMUSCULAR | Status: DC | PRN

## 2014-02-16 MED ORDER — SIMETHICONE 80 MG PO CHEW
80.0000 mg | CHEWABLE_TABLET | ORAL | Status: DC
  Administered 2014-02-16 – 2014-02-18 (×2): 80 mg via ORAL
  Filled 2014-02-16 (×2): qty 1

## 2014-02-16 MED ORDER — TETANUS-DIPHTH-ACELL PERTUSSIS 5-2.5-18.5 LF-MCG/0.5 IM SUSP
0.5000 mL | Freq: Once | INTRAMUSCULAR | Status: DC
Start: 2014-02-17 — End: 2014-02-18

## 2014-02-16 MED ORDER — SIMETHICONE 80 MG PO CHEW: 80.0000 mg | CHEWABLE_TABLET | ORAL | Status: DC | PRN

## 2014-02-16 MED ORDER — KETOROLAC TROMETHAMINE 30 MG/ML IJ SOLN: 30.0000 mg | Freq: Four times a day (QID) | INTRAMUSCULAR | Status: AC | PRN

## 2014-02-16 MED ORDER — ONDANSETRON HCL 4 MG/2ML IJ SOLN: 4.0000 mg | INTRAMUSCULAR | Status: DC | PRN

## 2014-02-16 MED ORDER — DIPHENHYDRAMINE HCL 25 MG PO CAPS: 25.0000 mg | ORAL_CAPSULE | ORAL | Status: DC | PRN

## 2014-02-16 MED ORDER — MENTHOL 3 MG MT LOZG: 1.0000 | LOZENGE | OROMUCOSAL | Status: DC | PRN

## 2014-02-16 MED ORDER — FENTANYL CITRATE 0.05 MG/ML IJ SOLN
INTRAMUSCULAR | Status: AC
  Filled 2014-02-16: qty 2

## 2014-02-16 MED ORDER — FAMOTIDINE IN NACL 20-0.9 MG/50ML-% IV SOLN
20.0000 mg | Freq: Once | INTRAVENOUS | Status: AC
  Administered 2014-02-16: 20 mg via INTRAVENOUS
  Filled 2014-02-16: qty 50

## 2014-02-16 MED ORDER — FENTANYL CITRATE 0.05 MG/ML IJ SOLN
INTRAMUSCULAR | Status: DC | PRN
  Administered 2014-02-16: 12.5 ug via INTRATHECAL

## 2014-02-16 MED ORDER — BUPIVACAINE IN DEXTROSE 0.75-8.25 % IT SOLN
INTRATHECAL | Status: DC | PRN
  Administered 2014-02-16: 1.4 mL via INTRATHECAL

## 2014-02-16 MED ORDER — KETOROLAC TROMETHAMINE 60 MG/2ML IM SOLN
60.0000 mg | Freq: Once | INTRAMUSCULAR | Status: DC | PRN
  Filled 2014-02-16: qty 2

## 2014-02-16 MED ORDER — PHENYLEPHRINE 8 MG IN D5W 100 ML (0.08MG/ML) PREMIX OPTIME
INJECTION | INTRAVENOUS | Status: DC | PRN
  Administered 2014-02-16: 45 ug/min via INTRAVENOUS

## 2014-02-16 MED ORDER — CITRIC ACID-SODIUM CITRATE 334-500 MG/5ML PO SOLN
30.0000 mL | Freq: Once | ORAL | Status: AC
Start: 2014-02-16 — End: 2014-02-16
  Administered 2014-02-16: 30 mL via ORAL
  Filled 2014-02-16: qty 15

## 2014-02-16 MED ORDER — PRENATAL MULTIVITAMIN CH
1.0000 | ORAL_TABLET | Freq: Every day | ORAL | Status: DC
  Administered 2014-02-16 – 2014-02-18 (×3): 1 via ORAL
  Filled 2014-02-16 (×3): qty 1

## 2014-02-16 MED ORDER — KETOROLAC TROMETHAMINE 30 MG/ML IJ SOLN
INTRAMUSCULAR | Status: AC
  Administered 2014-02-16: 30 mg via INTRAVENOUS
  Filled 2014-02-16: qty 1

## 2014-02-16 MED ORDER — DIPHENHYDRAMINE HCL 50 MG/ML IJ SOLN: 12.5000 mg | INTRAMUSCULAR | Status: DC | PRN

## 2014-02-16 MED ORDER — LACTATED RINGERS IV SOLN
INTRAVENOUS | Status: DC | PRN
  Administered 2014-02-16: 02:00:00 via INTRAVENOUS

## 2014-02-16 MED ORDER — ZOLPIDEM TARTRATE 5 MG PO TABS
5.0000 mg | ORAL_TABLET | Freq: Every evening | ORAL | Status: DC | PRN
Start: 2014-02-16 — End: 2014-02-18

## 2014-02-16 MED ORDER — ONDANSETRON HCL 4 MG/2ML IJ SOLN
INTRAMUSCULAR | Status: AC
  Filled 2014-02-16: qty 2

## 2014-02-16 MED ORDER — MEASLES, MUMPS & RUBELLA VAC ~~LOC~~ INJ
0.5000 mL | INJECTION | Freq: Once | SUBCUTANEOUS | Status: DC
  Filled 2014-02-16: qty 0.5

## 2014-02-16 MED ORDER — WITCH HAZEL-GLYCERIN EX PADS: 1.0000 "application " | MEDICATED_PAD | CUTANEOUS | Status: DC | PRN

## 2014-02-16 MED ORDER — OXYTOCIN 10 UNIT/ML IJ SOLN
INTRAMUSCULAR | Status: AC
  Filled 2014-02-16: qty 4

## 2014-02-16 MED ORDER — CEFAZOLIN SODIUM-DEXTROSE 2-3 GM-% IV SOLR
2.0000 g | Freq: Once | INTRAVENOUS | Status: AC
  Administered 2014-02-16: 2 g via INTRAVENOUS
  Filled 2014-02-16: qty 50

## 2014-02-16 MED ORDER — ONDANSETRON HCL 4 MG PO TABS: 4.0000 mg | ORAL_TABLET | ORAL | Status: DC | PRN

## 2014-02-16 MED ORDER — ONDANSETRON HCL 4 MG/2ML IJ SOLN
INTRAMUSCULAR | Status: DC | PRN
  Administered 2014-02-16: 4 mg via INTRAVENOUS

## 2014-02-16 MED ORDER — NALBUPHINE HCL 10 MG/ML IJ SOLN: 5.0000 mg | INTRAMUSCULAR | Status: DC | PRN

## 2014-02-16 MED ORDER — ONDANSETRON HCL 4 MG/2ML IJ SOLN: 4.0000 mg | Freq: Three times a day (TID) | INTRAMUSCULAR | Status: DC | PRN

## 2014-02-16 MED ORDER — DIBUCAINE 1 % RE OINT: 1.0000 "application " | TOPICAL_OINTMENT | RECTAL | Status: DC | PRN

## 2014-02-16 MED ORDER — NALOXONE HCL 1 MG/ML IJ SOLN
1.0000 ug/kg/h | INTRAVENOUS | Status: DC | PRN
  Filled 2014-02-16: qty 2

## 2014-02-16 MED ORDER — OXYTOCIN 10 UNIT/ML IJ SOLN
40.0000 [IU] | INTRAMUSCULAR | Status: DC | PRN
  Administered 2014-02-16: 40 [IU] via INTRAVENOUS

## 2014-02-16 MED ORDER — SIMETHICONE 80 MG PO CHEW
80.0000 mg | CHEWABLE_TABLET | Freq: Three times a day (TID) | ORAL | Status: DC
  Administered 2014-02-16 – 2014-02-18 (×6): 80 mg via ORAL
  Filled 2014-02-16 (×6): qty 1

## 2014-02-16 MED ORDER — 0.9 % SODIUM CHLORIDE (POUR BTL) OPTIME
TOPICAL | Status: DC | PRN
  Administered 2014-02-16: 1000 mL

## 2014-02-16 MED ORDER — NALOXONE HCL 0.4 MG/ML IJ SOLN: 0.4000 mg | INTRAMUSCULAR | Status: DC | PRN

## 2014-02-16 MED ORDER — SCOPOLAMINE 1 MG/3DAYS TD PT72
MEDICATED_PATCH | TRANSDERMAL | Status: AC
  Administered 2014-02-16: 1.5 mg via TRANSDERMAL
  Filled 2014-02-16: qty 1

## 2014-02-16 MED ORDER — LANOLIN HYDROUS EX OINT: 1.0000 "application " | TOPICAL_OINTMENT | CUTANEOUS | Status: DC | PRN

## 2014-02-16 MED ORDER — DIPHENHYDRAMINE HCL 50 MG/ML IJ SOLN: 25.0000 mg | INTRAMUSCULAR | Status: DC | PRN

## 2014-02-16 MED ORDER — OXYTOCIN 40 UNITS IN LACTATED RINGERS INFUSION - SIMPLE MED: 62.5000 mL/h | INTRAVENOUS | Status: AC

## 2014-02-16 MED ORDER — SENNOSIDES-DOCUSATE SODIUM 8.6-50 MG PO TABS
2.0000 | ORAL_TABLET | ORAL | Status: DC
  Administered 2014-02-16 – 2014-02-18 (×2): 2 via ORAL
  Filled 2014-02-16 (×2): qty 2

## 2014-02-16 MED ORDER — OXYCODONE-ACETAMINOPHEN 5-325 MG PO TABS
1.0000 | ORAL_TABLET | ORAL | Status: DC | PRN
  Administered 2014-02-16 – 2014-02-18 (×5): 1 via ORAL
  Filled 2014-02-16 (×5): qty 1

## 2014-02-16 MED ORDER — MORPHINE SULFATE (PF) 0.5 MG/ML IJ SOLN
INTRAMUSCULAR | Status: DC | PRN
  Administered 2014-02-16: .1 mg via INTRATHECAL

## 2014-02-16 MED ORDER — KETOROLAC TROMETHAMINE 30 MG/ML IJ SOLN
30.0000 mg | Freq: Four times a day (QID) | INTRAMUSCULAR | Status: AC | PRN
  Administered 2014-02-16: 30 mg via INTRAVENOUS

## 2014-02-16 MED ORDER — LACTATED RINGERS IV SOLN: INTRAVENOUS | Status: DC

## 2014-02-16 MED ORDER — LACTATED RINGERS IV SOLN
INTRAVENOUS | Status: DC | PRN
  Administered 2014-02-16 (×2): via INTRAVENOUS

## 2014-02-16 MED ORDER — DIPHENHYDRAMINE HCL 25 MG PO CAPS: 25.0000 mg | ORAL_CAPSULE | Freq: Four times a day (QID) | ORAL | Status: DC | PRN

## 2014-02-16 MED ORDER — IBUPROFEN 600 MG PO TABS
600.0000 mg | ORAL_TABLET | Freq: Four times a day (QID) | ORAL | Status: DC
  Administered 2014-02-16 – 2014-02-18 (×9): 600 mg via ORAL
  Filled 2014-02-16 (×9): qty 1

## 2014-02-16 MED ORDER — MORPHINE SULFATE 0.5 MG/ML IJ SOLN
INTRAMUSCULAR | Status: AC
  Filled 2014-02-16: qty 10

## 2014-02-16 SURGICAL SUPPLY — 45 items
ADH SKN CLS APL DERMABOND .7 (GAUZE/BANDAGES/DRESSINGS)
APL SKNCLS STERI-STRIP NONHPOA (GAUZE/BANDAGES/DRESSINGS) ×1
BARRIER ADHS 3X4 INTERCEED (GAUZE/BANDAGES/DRESSINGS) ×3 IMPLANT
BENZOIN TINCTURE PRP APPL 2/3 (GAUZE/BANDAGES/DRESSINGS) ×2 IMPLANT
BRR ADH 4X3 ABS CNTRL BYND (GAUZE/BANDAGES/DRESSINGS) ×1
CLAMP CORD UMBIL (MISCELLANEOUS) IMPLANT
CLOSURE WOUND 1/2 X4 (GAUZE/BANDAGES/DRESSINGS) ×1
CLOTH BEACON ORANGE TIMEOUT ST (SAFETY) ×3 IMPLANT
DERMABOND ADVANCED (GAUZE/BANDAGES/DRESSINGS)
DERMABOND ADVANCED .7 DNX12 (GAUZE/BANDAGES/DRESSINGS) IMPLANT
DRAPE LG THREE QUARTER DISP (DRAPES) IMPLANT
DRSG OPSITE POSTOP 4X10 (GAUZE/BANDAGES/DRESSINGS) ×3 IMPLANT
DURAPREP 26ML APPLICATOR (WOUND CARE) ×3 IMPLANT
ELECT REM PT RETURN 9FT ADLT (ELECTROSURGICAL) ×3
ELECTRODE REM PT RTRN 9FT ADLT (ELECTROSURGICAL) ×1 IMPLANT
EXTRACTOR VACUUM BELL STYLE (SUCTIONS) IMPLANT
GLOVE BIO SURGEON STRL SZ7 (GLOVE) ×3 IMPLANT
GLOVE BIOGEL PI IND STRL 7.0 (GLOVE) ×1 IMPLANT
GLOVE BIOGEL PI INDICATOR 7.0 (GLOVE) ×2
GOWN STRL REUS W/TWL LRG LVL3 (GOWN DISPOSABLE) ×6 IMPLANT
KIT ABG SYR 3ML LUER SLIP (SYRINGE) IMPLANT
NDL HYPO 25X5/8 SAFETYGLIDE (NEEDLE) IMPLANT
NEEDLE HYPO 25X5/8 SAFETYGLIDE (NEEDLE) IMPLANT
NS IRRIG 1000ML POUR BTL (IV SOLUTION) ×3 IMPLANT
PACK C SECTION WH (CUSTOM PROCEDURE TRAY) ×3 IMPLANT
PAD ABD 8X7 1/2 STERILE (GAUZE/BANDAGES/DRESSINGS) ×2 IMPLANT
PAD OB MATERNITY 4.3X12.25 (PERSONAL CARE ITEMS) ×3 IMPLANT
RTRCTR C-SECT PINK 25CM LRG (MISCELLANEOUS) ×3 IMPLANT
SPONGE GAUZE 4X4 12PLY STER LF (GAUZE/BANDAGES/DRESSINGS) ×2 IMPLANT
SPONGE LAP 18X18 X RAY DECT (DISPOSABLE) ×2 IMPLANT
STRIP CLOSURE SKIN 1/2X4 (GAUZE/BANDAGES/DRESSINGS) ×1 IMPLANT
SUT CHROMIC 0 CTX 36 (SUTURE) ×15 IMPLANT
SUT PLAIN 2 0 (SUTURE)
SUT PLAIN 2 0 XLH (SUTURE) ×3 IMPLANT
SUT PLAIN ABS 2-0 54XMFL TIE (SUTURE) IMPLANT
SUT VIC AB 0 CT1 27 (SUTURE) ×6
SUT VIC AB 0 CT1 27XBRD ANBCTR (SUTURE) ×2 IMPLANT
SUT VIC AB 2-0 CT1 27 (SUTURE) ×3
SUT VIC AB 2-0 CT1 TAPERPNT 27 (SUTURE) ×1 IMPLANT
SUT VIC AB 2-0 SH 27 (SUTURE) ×3
SUT VIC AB 2-0 SH 27XBRD (SUTURE) IMPLANT
SUT VIC AB 4-0 KS 27 (SUTURE) ×3 IMPLANT
TOWEL OR 17X24 6PK STRL BLUE (TOWEL DISPOSABLE) ×3 IMPLANT
TRAY FOLEY CATH 14FR (SET/KITS/TRAYS/PACK) IMPLANT
WATER STERILE IRR 1000ML POUR (IV SOLUTION) ×3 IMPLANT

## 2014-02-16 NOTE — Transfer of Care (Signed)
Immediate Anesthesia Transfer of Care Note  Patient: [name]  Procedure(s) Performed: Procedure(s): CESAREAN SECTION (N/A)  Patient Location: PACU  Anesthesia Type:Spinal  Level of Consciousness: awake, alert  and oriented  Airway & Oxygen Therapy: Patient Spontanous Breathing  Post-op Assessment: Report given to PACU RN and Post -op Vital signs reviewed and stable  Post vital signs: Reviewed and stable  Complications: No apparent anesthesia complications 

## 2014-02-16 NOTE — Anesthesia Postprocedure Evaluation (Signed)
Anesthesia Post Note  Patient: [name]  Procedure(s) Performed: Procedure(s) (LRB): CESAREAN SECTION (N/A)  Anesthesia type: Spinal  Patient location: Mother/Baby  Post pain: Pain level controlled  Post assessment: Post-op Vital signs reviewed  Last Vitals:  Filed Vitals:   02/16/14 1052  BP: 110/64  Pulse: 77  Temp: 36.8 C  Resp: 16    Post vital signs: Reviewed  Level of consciousness: awake  Complications: No apparent anesthesia complications

## 2014-02-16 NOTE — Anesthesia Procedure Notes (Signed)
Spinal  Patient location during procedure: OR Staffing Performed by: anesthesiologist  Preanesthetic Checklist Completed: patient identified, site marked, surgical consent, pre-op evaluation, timeout performed, IV checked, risks and benefits discussed and monitors and equipment checked Spinal Block Patient position: sitting Prep: ChloraPrep Patient monitoring: heart rate, continuous pulse ox and blood pressure Approach: right paramedian Location: L3-4 Injection technique: single-shot Needle Needle type: Sprotte  Needle gauge: 24 G Needle length: 9 cm Additional Notes Expiration date of kit checked and confirmed. Patient tolerated procedure well, without complications.

## 2014-02-16 NOTE — Lactation Note (Signed)
This note was copied from the chart of [name]. Lactation Consultation Note Baby unable to sustain a latch. Spits out nipple, tongue thrusting large nipple out of mouth. Gave mom DEBP and #30 flanges. Hand expression taught, no colostrum noted. BF first child who is 5131yrs. old for 6 months. Mom states her nipples were not that large with the first child and didn't have any trouble. Baby will latch but will not stay on. Will suck only a few times. Moms nipple are tough and skin flaking on baby's mouth. Encouraged mom to DEBP for 5 min. To stimulate colostrum flow, then latch baby. If baby will not latch then pump for 10 min. Give baby any colostrum w/curve tip syring. Mom is concerned if baby will not feed, and he's hungry. States she will be happy to pump and give in bottle and can supplement w/formula until her milk comes in. Explained baby is still learning to eat and staff will cont. To work wit mom and baby to obtain a latch. Mom encouraged to feed baby 8-12 times/24 hours and with feeding cues. Specifics of an asymmetric latch shown. Mom encouraged to feed baby w/feeding cuesReviewed Baby & Me book's Breastfeeding Basics. Encouraged to call for assistance if needed and to verify proper latch. Mom shown how to use DEBP & how to disassemble, clean, & reassemble parts. Educated about newborn behaviorWH/LC brochure given w/resources, support groups and LC services.Referred to Baby and Me Book in Breastfeeding section Pg. 22-23 for position options and Proper latch demonstration. Patient Name: Pamela Everlean CherryOttilia Salvino ZOXWR'UToday's Date: 02/16/2014 Reason for consult: Initial assessment;Difficult latch   Maternal Data Infant to breast within first hour of birth: No Breastfeeding delayed due to:: Other (comment) (wouldn't latch) Has patient been taught Hand Expression?: Yes Does the patient have breastfeeding experience prior to this delivery?: No  Feeding Feeding Type: Breast Fed Length of feed: 2  min  LATCH Score/Interventions Latch: Repeated attempts needed to sustain latch, nipple held in mouth throughout feeding, stimulation needed to elicit sucking reflex. Intervention(s): Adjust position;Assist with latch;Breast massage;Breast compression  Audible Swallowing: None  Type of Nipple: Everted at rest and after stimulation (very large )  Comfort (Breast/Nipple): Soft / non-tender     Hold (Positioning): Assistance needed to correctly position infant at breast and maintain latch. Intervention(s): Breastfeeding basics reviewed;Support Pillows;Position options;Skin to skin  LATCH Score: 6  Lactation Tools Discussed/Used Tools: Pump;Flanges Flange Size: 30 Breast pump type: Double-Electric Breast Pump Pump Review: Setup, frequency, and cleaning Initiated by:: Peri JeffersonL. Antino Mayabb RN Date initiated:: 02/16/14   Consult Status Consult Status: Follow-up Date: 02/16/14 Follow-up type: In-patient    Charyl DancerCARVER, Saleen Peden G 02/16/2014, 2:11 PM

## 2014-02-16 NOTE — Brief Op Note (Signed)
02/16/2014  4:16 AM  PATIENT:  [name]  33 y.o. female  PRE-OPERATIVE DIAGNOSIS: Pregnancy at 40 4/7 weeks, previous cesarean section, in labor  POST-OPERATIVE DIAGNOSIS:  Same  PROCEDURE:  Procedure(s): CESAREAN SECTION (N/A), Repeat LTCS, 2 layer closure  SURGEON:  Surgeon(s) and Role:    * Geryl RankinsEvelyn Edson Deridder, MD - Primary  PHYSICIAN ASSISTANT: None  ASSISTANTS: Nigel BridgemanVicki Latham, CNM   ANESTHESIA:   spinal  EBL:  Total I/O In: 2200 [I.V.:2200] Out: 1750 [Urine:850; Blood:900]  BLOOD ADMINISTERED:none  DRAINS: Urinary Catheter (Foley)   LOCAL MEDICATIONS USED:  NONE  SPECIMEN:  No Specimen  DISPOSITION OF SPECIMEN:  N/A  COUNTS:  YES  TOURNIQUET:  * No tourniquets in log *  DICTATION: .Other Dictation: Dictation Number [id number]  PLAN OF CARE: Admit to inpatient   PATIENT DISPOSITION:  PACU - hemodynamically stable.   Delay start of Pharmacological VTE agent (>24hrs) due to surgical blood loss or risk of bleeding: yes

## 2014-02-16 NOTE — Anesthesia Preprocedure Evaluation (Addendum)
Anesthesia Evaluation  Patient identified by MRN, date of birth, ID band Patient awake    Reviewed: Allergy & Precautions, H&P , NPO status , Patient's Chart, lab work & pertinent test results  Airway Mallampati: II TM Distance: >3 FB Neck ROM: Full    Dental no notable dental hx.    Pulmonary neg pulmonary ROS,  breath sounds clear to auscultation  Pulmonary exam normal       Cardiovascular negative cardio ROS  Rhythm:Regular Rate:Normal     Neuro/Psych negative neurological ROS  negative psych ROS   GI/Hepatic negative GI ROS, Neg liver ROS,   Endo/Other  negative endocrine ROS  Renal/GU negative Renal ROS  negative genitourinary   Musculoskeletal negative musculoskeletal ROS (+)   Abdominal   Peds negative pediatric ROS (+)  Hematology negative hematology ROS (+)   Anesthesia Other Findings   Reproductive/Obstetrics negative OB ROS                           Anesthesia Physical Anesthesia Plan  ASA: II and emergent  Anesthesia Plan: Spinal   Post-op Pain Management:    Induction: Intravenous  Airway Management Planned: Natural Airway  Additional Equipment:   Intra-op Plan:   Post-operative Plan:   Informed Consent: I have reviewed the patients History and Physical, chart, labs and discussed the procedure including the risks, benefits and alternatives for the proposed anesthesia with the patient or authorized representative who has indicated his/her understanding and acceptance.   Dental advisory given  Plan Discussed with: CRNA  Anesthesia Plan Comments:         Anesthesia Quick Evaluation

## 2014-02-16 NOTE — Transfer of Care (Deleted)
Immediate Anesthesia Transfer of Care Note  Patient: [name]  Procedure(s) Performed: Procedure(s): CESAREAN SECTION (N/A)  Patient Location: PACU  Anesthesia Type:Spinal  Level of Consciousness: awake, alert  and oriented  Airway & Oxygen Therapy: Patient Spontanous Breathing  Post-op Assessment: Report given to PACU RN and Post -op Vital signs reviewed and stable  Post vital signs: Reviewed and stable  Complications: No apparent anesthesia complications

## 2014-02-16 NOTE — H&P (Signed)
History reviewed. Introduced to patient. Discussed R/B/A.  Does not want BTL. Consent signed.

## 2014-02-16 NOTE — Anesthesia Postprocedure Evaluation (Deleted)
  Anesthesia Post-op Note  Patient: [name]  Procedure(s) Performed: Procedure(s): CESAREAN SECTION (N/A)  Patient Location: PACU  Anesthesia Type:Spinal  Level of Consciousness: awake, alert , oriented and patient cooperative  Airway and Oxygen Therapy: Patient Spontanous Breathing  Post-op Pain: none  Post-op Assessment: Post-op Vital signs reviewed  Post-op Vital Signs: Reviewed and stable  Last Vitals:  Filed Vitals:   02/16/14 0338  BP: 102/59  Pulse: 91  Temp: 36.7 C  Resp: 17    Complications: No apparent anesthesia complications

## 2014-02-16 NOTE — MAU Note (Signed)
PT  SAYS SHE WAS HERE  YESTERDAY-   SENT HOME A AND HAS  UC ALL DAY.   GAVE AMBIEN-  TOOK  AT 8PM  TONIGHT.Marland Kitchen.  DENIES HSV AND MRSA.  GBS-  POSITIVE

## 2014-02-16 NOTE — Op Note (Signed)
NAME:  [name], [name]           ACCOUNT NO.:  [id number]  MEDICAL RECORD NO.:  [id number]  LOCATION:  [facility]                          FACILITY:  [facility]  PHYSICIAN:  Pieter PartridgeEvelyn B Brylee Mcgreal, MD   DATE OF BIRTH:  09-Jan-1981  DATE OF PROCEDURE:  02/16/2014 DATE OF DISCHARGE:                              OPERATIVE REPORT   PREOPERATIVE DIAGNOSES:  Intrauterine pregnancy at 40-4/7th weeks, history of previous cesarean section, declined vaginal birth after cesarian section, labor.  POSTOPERATIVE DIAGNOSES:  Intrauterine pregnancy at 40-4/7th weeks, history of previous cesarean section, declined vaginal birth after cesarian section, labor.  PROCEDURE:  Repeat low-transverse cesarean section, two-layer closure.  SURGEON:  Crist FatSandra A. Rivard, M.D.  ASSISTANRenaldo Reel:  Vicki L. Emilee HeroLatham, C.N.M.  ANESTHESIA:  Spinal.  EBL:  900 mL.  URINE:  850 mL of clear urine.  BLOOD ADMINISTERED:  None.  DRAINS:  Foley catheter.  LOCAL:  None.  SPECIMENS:  None.  COUNTS CORRECT:  Yes.  DISPOSITION:  To PACU, hemodynamically stable.  FINDINGS:  Viable female infant, Apgars 9 and 9.  Nuchal cord x1.  Thick meconium noted.  Thin lower uterine segment, but no window visualized. Significant adhesive disease, thick adhesion of the anterior uterus to the anterior abdominal wall.  Normal right fallopian tube, otherwise uterine anatomy was difficult to visualize, suspected fibroids palpated.  PROCEDURE IN DETAIL:  [name] is a 33 year old, gravida 2, para 1 at 40-4/7th weeks who presented yesterday with complaint of contractions and was found to be closed.  She presented this morning and her cervix was 2 cm, 100% effaced.  She had previously desired to trial of labor after cesarean, but at the time of admission, desired to proceed with repeat low-transverse cesarean section.  Risks, benefits, and alternatives were discussed.  Consent obtained.  The patient was taken to the operating room where she underwent  spinal anesthesia without difficulty.  She was then placed in the dorsal supine position with a leftward tilt, and prepped and draped in a normal sterile fashion.  The incision was marked prior to draping.  After adequate anesthesia was confirmed, the Pfannenstiel skin incision was placed over the previous incision and carried down to the underlying layer of the fascia with the Bovie.  The fascia was then opened with the curved Mayo scissors laterally.  The fascia was then opened, was taken off of the rectus muscle above and below with the Kocher clamp for retraction.  The previous incision was little low on the symphysis pubis.  Also, there was not much dissection there.  Initially once the fascia was taken down off of the rectus muscles, everything appeared normal.  However, I opened the rectus muscles and the right under the rectus muscle, there was some adhesion of the anterior portion of the uterus to the anterior abdominal wall.  It was pretty thick probably, the stalk was maybe 5-6 cm.  There was a window on the right and left side, so we could palpate into the abdominal cavity, but could not go all the way through because of this dense adhesion at the midline.  The bladder was high in the abdomen.  The peritoneal window that we were able to obtain was on  the left-hand side and the vesicouterine peritoneum was contract up to the right, made sure that Metzenbaum scissors were seen prior to incising the peritoneum.  After making that incision and doing a little small Maylard incision on the right rectus muscle, adequate visualization was seen, but it was limited to a very small space in the lower uterine segment.  No window in the lower uterine segment was visualized.  We used a small bladder blade for retraction.  We were unable to use the Alexis retractor due to the dense adhesion at the midline.  Transverse incision was made on the lower uterine segment and extended laterally  with the bandage scissors.  The head was easily at the incision and with fundal pressure, was delivered easily atraumatically. Nose and mouth were suctioned.  Nuchal cord x1 reduced.  Shoulder was delivered easily.  Cord was clamped x2, and baby was handed off to the awaiting NICU staff.  The placenta did not come easily with gentle traction.  With uterine massage, it did deliver, but it did come out in two pieces, make sure that the uterus was cleared of all clots and debris and no remaining placental tissue was visualized.  The hysterotomy incision was then grasped with ring forceps and closed with 0 chromic in a running locked fashion.  Second layer of the same suture was used for imbrication and for hemostasis.  On the right portion near the uterine arteries, I palpated behind the uterus just to make sure that that area was not breached.  I used 0 chromic with figure-of-eight for hemostasis and then I used 2-0 Vicryl on SH needle.  Hemostasis was achieved.  Copious irrigation was performed of all the gutters to get rid of the meconium and the fornix.  Once the lower uterine segment appeared dry, Interceed was applied to the lower uterine segment.  The bladder was identified and no injury to the bladder was noted.  There was some bleeding from some small perforating vessels.  It was cauterized with the Bovie.  The bladder did not appear to be draining well.  The Foley bulb was well palpated, so there was no injury detected because the bladder was inflated.  The rectus muscles were thoroughly inspected and so was the fascia for any bleeding.  Hemostasis with Bovie was used as needed.  The rectus muscles were reapproximated with the 2-0 Vicryl because of the inflated bladder.  The fascia was then reapproximated with 0 Vicryl in a continuous running fashion.  The subcutaneous space was then irrigated and reapproximated with 2-0 plain gut and then skin was reapproximated with 4-0 Monocryl  due to the patient's hypertrophic scar.  Copious irrigation was performed an each layer prior to closure.  A pressure dressing of Steri-Strips and benzoin was to be applied and a pressure dressing was to be applied.  The patient tolerated the procedure well.  All instrument, sponge, and needle counts were correct x3.  She received Ancef 2 g IV prior to the procedure.  The baby remained in the OR nursing and doing skin to skin at the completion of the procedure.  There was no bleeding and the urine was clear at the end of the procedure.     Pieter PartridgeEvelyn B Satya Buttram, MD     EBV/MEDQ  D:  02/16/2014  T:  02/16/2014  Job:  [id number]

## 2014-02-16 NOTE — H&P (Signed)
[name] is a 33 y.o. female, G2P1001 at 1340 4/7 weeks, presenting for UCs all day and passing of mucusy d/c.  Seen in MAU last night/early am, with cervix closed and irregular contractions.  Denies leaking of fluid or bleeding, reports FM.  Had originally planned a VBAC, now wants to proceed with C/S. Was originally scheduled for induction on 02/19/14.  Declines tubal.  Patient Active Problem List   Diagnosis Date Noted  . Previous cesarean section--FTP 02/16/2014  . Short stature 02/16/2014  . GBS (group B Streptococcus carrier), +RV culture, currently pregnant 02/16/2014  . Late prenatal care--19 weeks 02/16/2014  . Thrombocytopenia, unspecified--platelets 126 at 28 weeks. 02/16/2014  . Normal labor 02/16/2014  . 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  History of present pregnancy: Patient entered care at 4419 6/7 weeks.   EDC of 02/12/14 was established by 19 week US.   Anatomy scan:  19 6/7 weeks, with normal findings and a posterior placenta.   Additional US evaluations:  None.   Significant prenatal events:  TDAP 01/11/14.  Elevated 1 hour GTT, normal 3 hour GTT.  Quad screen WNL.  Slightly S<D at last visit, was scheduled for US at [facility].  Planned VBAC, but was somewhat uncertain.  Considered BTL, but now declines.  Scheduled for induction 02/19/14. Last evaluation:  Yesterday in MAU--cervix closed, irregular contractions.  OB History   Grav Para Term Preterm Abortions TAB SAB Ect Mult Living   2 1 1       1     2012--Primary LTCS due to FTP and chorio, female, 6+5, Dr. Estanislado Pandyivard  Past Medical History  Diagnosis Date  . Yeast infection   . H/O thrombocytopenia   . Chorioamnionitis   . Enlarged thyroid   . Thyromegaly   . FH: HTN (hypertension)    Past Surgical History  Procedure Laterality Date  . Cesarean section     Family History: family history includes Hypertension in her mother.  Social History:  reports that she has never smoked. She  has never used smokeless tobacco. She reports that she does not drink alcohol or use illicit drugs. Patient is African in ethnicity, married to the FOB (Rindu Masimu).  Patient graduate educated, employed as a Runner, broadcasting/film/videoteacher.   Prenatal Transfer Tool  Maternal Diabetes: No Genetic Screening: Normal Quad screen Maternal Ultrasounds/Referrals: Normal Fetal Ultrasounds or other Referrals:  None Maternal Substance Abuse:  No Significant Maternal Medications:  None Significant Maternal Lab Results: Lab values include: Group B Strep positive    ROS:  + contractions, mucus d/c, +FM.  No Known Allergies   Dilation: 2 Effacement (%): 90 Exam by:: Jhonathan Desroches, CNM Height 4\' 11"  (1.499 m), weight 151 lb (68.493 kg).  BP 140/71, HR 112.   Chest clear Heart RRR without murmur Abd gravid, NT, FH 38 cm Pelvic: As above--slight BBOW, presenting part OOP.  Vtx to bedside US, small amount bloody mucus d/c Ext: WNL  FHR: Category 2--mild variables with some UCs, some drop out due to patient sitting straight up during contractions, but overall reassuring. UCs:  q 3-4 min  Prenatal labs: ABO, Rh:  B+ Antibody:  Neg Rubella:   Immune RPR:   NR HBsAg:  Neg  HIV:   NR GBS:  Positive Sickle cell/Hgb electrophoresis:  AA Pap:  03/2013 WNL GC:  Negative at [facility] Chlamydia:  Neg at [facility] Genetic screenings:  Negative Quad screen Glucola:  Elevated 1 hour GTT, normal 3 hour Other:  Hgb 12.6  at [facility], 10.8 at 28 weeks Platelets 151 08/27/13, 126 at 28 weeks Thyroid panel WNL 03/2013   Assessment/Plan: IUP at 40 4/7 weeks Early labor Previous C/S, desires repeat GBS positive Mild throbocytopenia Thyromegaly, with hx of normal thyroid testing.  [facility] with Dr. Dion BodyVarnado Routine MD pre-op orders R&B of C/S reviewed with patient and husband, including bleeding, infection, and damage to other organs.  Patient and husband seem to understand these risks and wish to proceed with repeat  C/S.  Declines tubal. Await CBC.  Jaramie Bastos CNM, [place] 02/16/2014, 1:28 AM

## 2014-02-17 NOTE — Progress Notes (Signed)
Subjective: Postop Day 1: Cesarean Delivery No complaints.  Pain controlled.  Lochia normal.  Breast feeding yes.  Has not ambulated in halls. Desires outpatient circumcision.  Objective: Temp:  [98.1 F (36.7 C)-99.1 F (37.3 C)] 98.8 F (37.1 C) (06/14 0552) Pulse Rate:  [76-101] 76 (06/14 0552) Resp:  [18] 18 (06/14 0552) BP: (96-106)/(58-67) 103/67 mmHg (06/14 0552) SpO2:  [95 %-98 %] 98 % (06/14 0552)  Physical Exam: Gen: NAD Lochia: Not visualized Uterine Fundus: firm, appropriately tender Incision: Honeycomb dressing completely soiled.  No obvious active bleeding. DVT Evaluation:  Edema present, no calf tenderness bilaterally    Recent Labs  02/16/14 0045 02/16/14 0713  HGB 12.0 11.5*  HCT 33.7* 33.1*    Assessment/Plan: Status post C-section-doing well postoperatively. Change honeycomb dressing. Encouraged ambulation in halls. Lactation support. [facility] to assume care in am.    [name], [name] 02/17/2014, 12:16 PM

## 2014-02-17 NOTE — Progress Notes (Signed)
Subjective: Postpartum Day 1: Repeat Cesarean Delivery  Patient up ad lib, reports no syncope or dizziness. Consuming regular diet without issues.  Denies flatus or bowel movement.  Pain controlled with oral meds.   Feeding:  Breast and supplementing until milk comes in Contraceptive plan:  Oral BCP  Objective: Vital signs in last 24 hours: Temp:  [98.1 F (36.7 C)-99.1 F (37.3 C)] 98.8 F (37.1 C) (06/14 0552) Pulse Rate:  [76-101] 76 (06/14 0552) Resp:  [16-18] 18 (06/14 0552) BP: (96-110)/(58-67) 103/67 mmHg (06/14 0552) SpO2:  [95 %-98 %] 98 % (06/14 0552)  Physical Exam:  General: alert, cooperative and no distress Lochia: appropriate Uterine Fundus: firm, Displaced to right, +1/U--Encouraged to urinate Abdomen:  + bowel sounds, Distended, soft, appropriate tender Incision: Honeycomb dressing intact, soiled DVT Evaluation: SCD Boots in place JP drain:   None   Recent Labs  02/16/14 0045 02/16/14 0713  HGB 12.0 11.5*  HCT 33.7* 33.1*  WBC 7.1 9.8    Assessment/Plan: Status post Cesarean section day 1. Breastfeeding Doing well postoperatively.   Up to ambulate as soon as possible Okay for shower Dressing change after shower Continue current care as ordered Plans for outpatient circumcision. Lactation consult  Erik Burkett LYNN CNM, MSN 02/17/2014, 10:24 AM

## 2014-02-18 ENCOUNTER — Encounter (HOSPITAL_COMMUNITY): Payer: Self-pay | Admitting: *Deleted

## 2014-02-18 MED ORDER — OXYCODONE-ACETAMINOPHEN 5-325 MG PO TABS: 1.0000 | ORAL_TABLET | ORAL | Status: AC | PRN

## 2014-02-18 MED ORDER — IBUPROFEN 600 MG PO TABS: 600.0000 mg | ORAL_TABLET | Freq: Four times a day (QID) | ORAL | Status: AC

## 2014-02-18 MED ORDER — FERROUS SULFATE 325 (65 FE) MG PO TABS: 325.0000 mg | ORAL_TABLET | Freq: Two times a day (BID) | ORAL | Status: AC

## 2014-02-18 NOTE — Discharge Summary (Signed)
Obstetric Discharge Summary Reason for Admission: onset of labor Prenatal Procedures: NST and ultrasound Intrapartum Procedures: cesarean: low cervical, transverse Postpartum Procedures: none Complications-Operative and Postpartum: none Hemoglobin  Date Value Ref Range Status  02/16/2014 11.5* 12.0 - 15.0 g/dL Final     HCT  Date Value Ref Range Status  02/16/2014 33.1* 36.0 - 46.0 % Final    Physical Exam:  General: alert and cooperative Lochia: appropriate Uterine Fundus: firm Incision: healing well, no significant drainage, no dehiscence DVT Evaluation: No evidence of DVT seen on physical exam. Left breast with small nodules   Discharge Diagnoses: Term Pregnancy-delivered.  Pt had a repeat CS by Dr Gunnar BullaVernado with multiple LOA.  She is doing well and desires discharge today  Discharge Information: Date: 02/18/2014 Activity: pelvic rest Diet: routine Medications: Ibuprofen, Iron and Percocet Condition: stable Instructions: refer to practice specific booklet Discharge to: home Follow-up Information   [facility]. (call and make an appointment.  )    Specialty:  Obstetrics and Gynecology   Contact information:   [address] [place] [id number] [phone]      Newborn Data: Live born female  Birth Weight: 6 lb 12.6 oz (3079 g) APGAR: 9, 9  Home with mother. Once breast milk comes in if breast nodules persist after one week  will refer for breast imaging  [name] A 02/18/2014, 10:04 AM

## 2014-02-18 NOTE — Discharge Instructions (Signed)
Cesarean Delivery °Care After °Refer to this sheet in the next few weeks. These instructions provide you with information on caring for yourself after your procedure. Your health care provider may also give you specific instructions. Your treatment has been planned according to current medical practices, but problems sometimes occur. Call your health care provider if you have any problems or questions after you go home. °HOME CARE INSTRUCTIONS  °· Only take over-the-counter or prescription medications as directed by your health care provider. °· Do not drink alcohol, especially if you are breastfeeding or taking medication to relieve pain. °· Do not chew or smoke tobacco. °· Continue to use good perineal care. Good perineal care includes: °· Wiping your perineum from front to back. °· Keeping your perineum clean. °· Check your surgical cut (incision) daily for increased redness, drainage, swelling, or separation of skin. °· Clean your incision gently with soap and water every day, and then pat it dry. If your health care provider says it is OK, leave the incision uncovered. Use a bandage (dressing) if the incision is draining fluid or appears irritated. If the adhesive strips across the incision do not fall off within 7 days, carefully peel them off. °· Hug a pillow when coughing or sneezing until your incision is healed. This helps to relieve pain. °· Do not use tampons or douche until your health care provider says it is okay. °· Shower, wash your hair, and take tub baths as directed by your health care provider. °· Wear a well-fitting bra that provides breast support. °· Limit wearing support panties or control-top hose. °· Drink enough fluids to keep your urine clear or pale yellow. °· Eat high-fiber foods such as whole grain cereals and breads, brown rice, beans, and fresh fruits and vegetables every day. These foods may help prevent or relieve constipation. °· Resume activities such as climbing stairs,  driving, lifting, exercising, or traveling as directed by your health care provider. °· Talk to your health care provider about resuming sexual activities. This is dependent upon your risk of infection, your rate of healing, and your comfort and desire to resume sexual activity. °· Try to have someone help you with your household activities and your newborn for at least a few days after you leave the hospital. °· Rest as much as possible. Try to rest or take a nap when your newborn is sleeping. °· Increase your activities gradually. °· Keep all of your scheduled postpartum appointments. It is very important to keep your scheduled follow-up appointments. At these appointments, your health care provider will be checking to make sure that you are healing physically and emotionally. °SEEK MEDICAL CARE IF:  °· You are passing large clots from your vagina. Save any clots to show your health care provider. °· You have a foul smelling discharge from your vagina. °· You have trouble urinating. °· You are urinating frequently. °· You have pain when you urinate. °· You have a change in your bowel movements. °· You have increasing redness, pain, or swelling near your incision. °· You have pus draining from your incision. °· Your incision is separating. °· You have painful, hard, or reddened breasts. °· You have a severe headache. °· You have blurred vision or see spots. °· You feel sad or depressed. °· You have thoughts of hurting yourself or your newborn. °· You have questions about your care, the care of your newborn, or medications. °· You are dizzy or lightheaded. °· You have a rash. °· You   have pain, redness, or swelling at the site of the removed intravenous access (IV) tube. °· You have nausea or vomiting. °· You stopped breastfeeding and have not had a menstrual period within 12 weeks of stopping. °· You are not breastfeeding and have not had a menstrual period within 12 weeks of delivery. °· You have a fever. °SEEK  IMMEDIATE MEDICAL CARE IF: °· You have persistent pain. °· You have chest pain. °· You have shortness of breath. °· You faint. °· You have leg pain. °· You have stomach pain. °· Your vaginal bleeding saturates 2 or more sanitary pads in 1 hour. °MAKE SURE YOU:  °· Understand these instructions. °· Will watch your condition. °· Will get help right away if you are not doing well or get worse. °Document Released: 05/15/2002 Document Revised: 04/25/2013 Document Reviewed: 04/19/2012 °ExitCare® Patient Information ©2014 [employer]. ° ° ° °

## 2014-02-19 ENCOUNTER — Inpatient Hospital Stay (HOSPITAL_COMMUNITY): Admission: RE | Admit: 2014-02-19 | Payer: BC Managed Care – PPO | Source: Ambulatory Visit

## 2014-02-26 ENCOUNTER — Encounter (HOSPITAL_COMMUNITY): Payer: Self-pay | Admitting: *Deleted

## 2014-05-15 ENCOUNTER — Other Ambulatory Visit: Payer: Self-pay | Admitting: Gastroenterology

## 2014-05-15 DIAGNOSIS — R748 Abnormal levels of other serum enzymes: Secondary | ICD-10-CM

## 2014-05-24 ENCOUNTER — Ambulatory Visit
Admission: RE | Admit: 2014-05-24 | Discharge: 2014-05-24 | Disposition: A | Discharge status: Home / Self Care | Payer: BC Managed Care – PPO | Source: Ambulatory Visit | Attending: Gastroenterology | Admitting: Gastroenterology

## 2014-05-24 DIAGNOSIS — R748 Abnormal levels of other serum enzymes: Secondary | ICD-10-CM

## 2014-10-03 IMAGING — MG MM DIAGNOSTIC UNILATERAL L
2 series · 2 of 2 positions shown · non-contrast
Comparison: Previous exams.

CLINICAL DATA: Status post ultrasound guided core biopsy left
breast mass

DIGITAL DIAGNOSTIC LEFT MAMMOGRAM

[L ML]
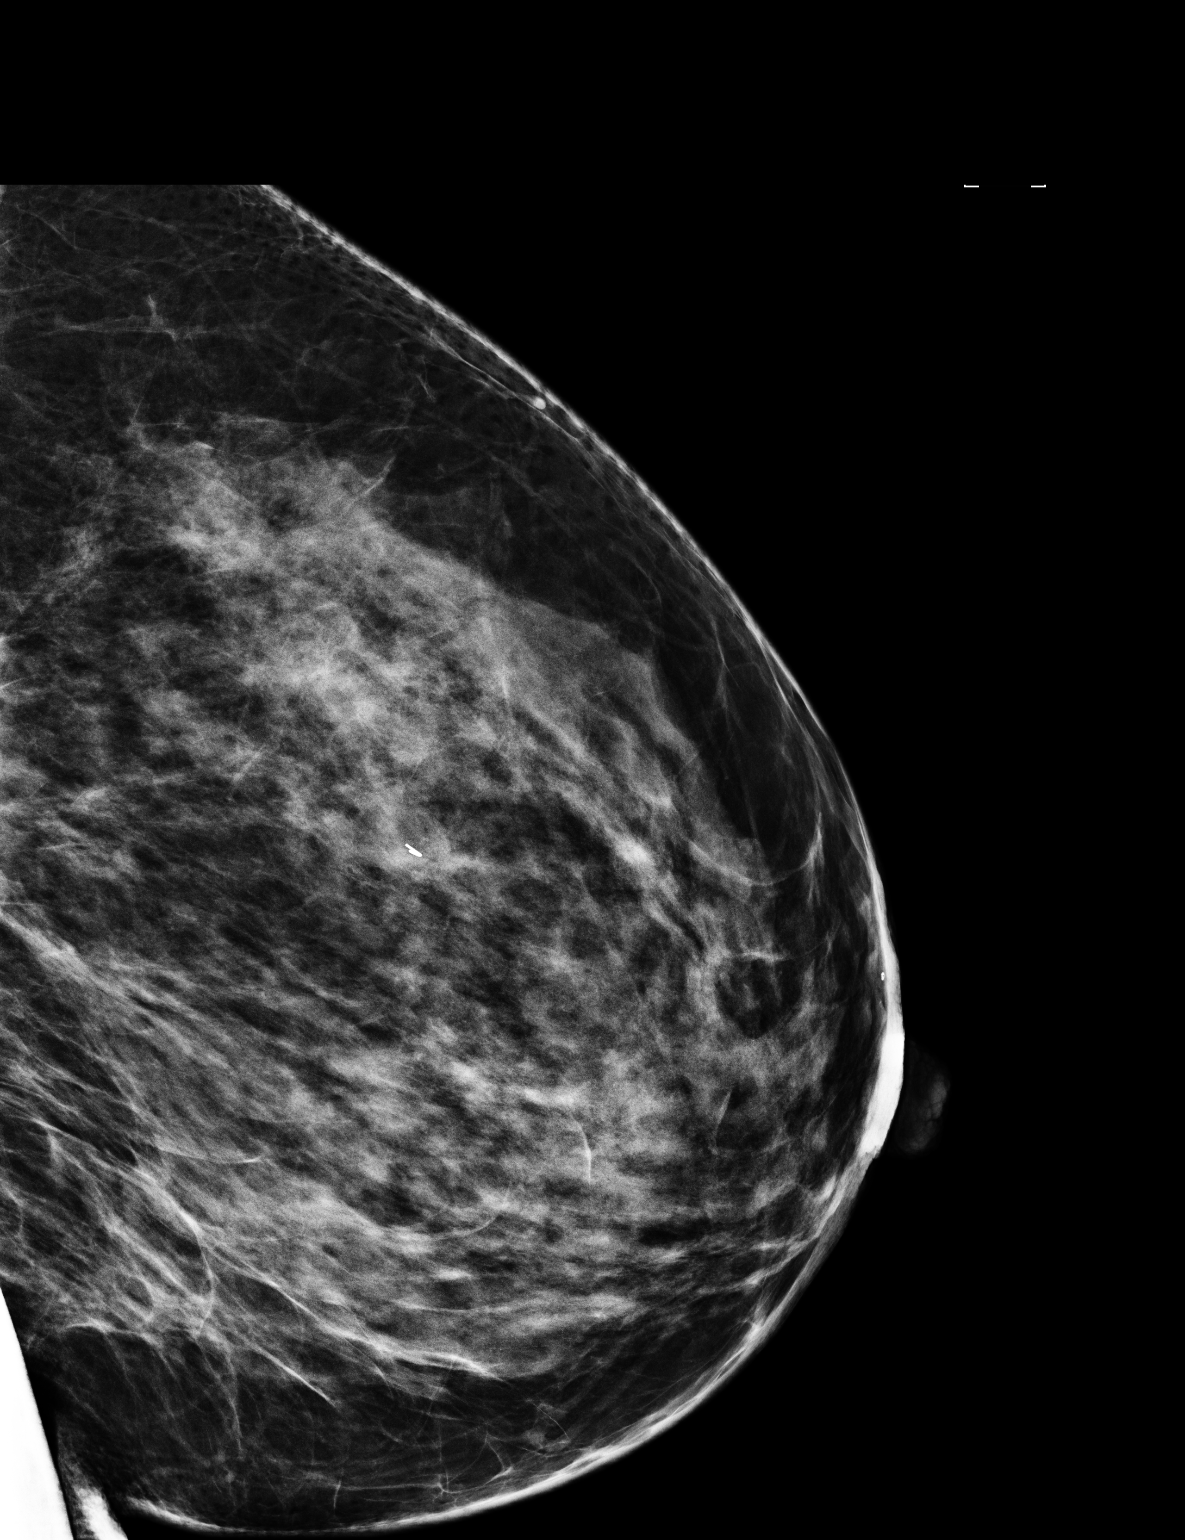

[L CC]
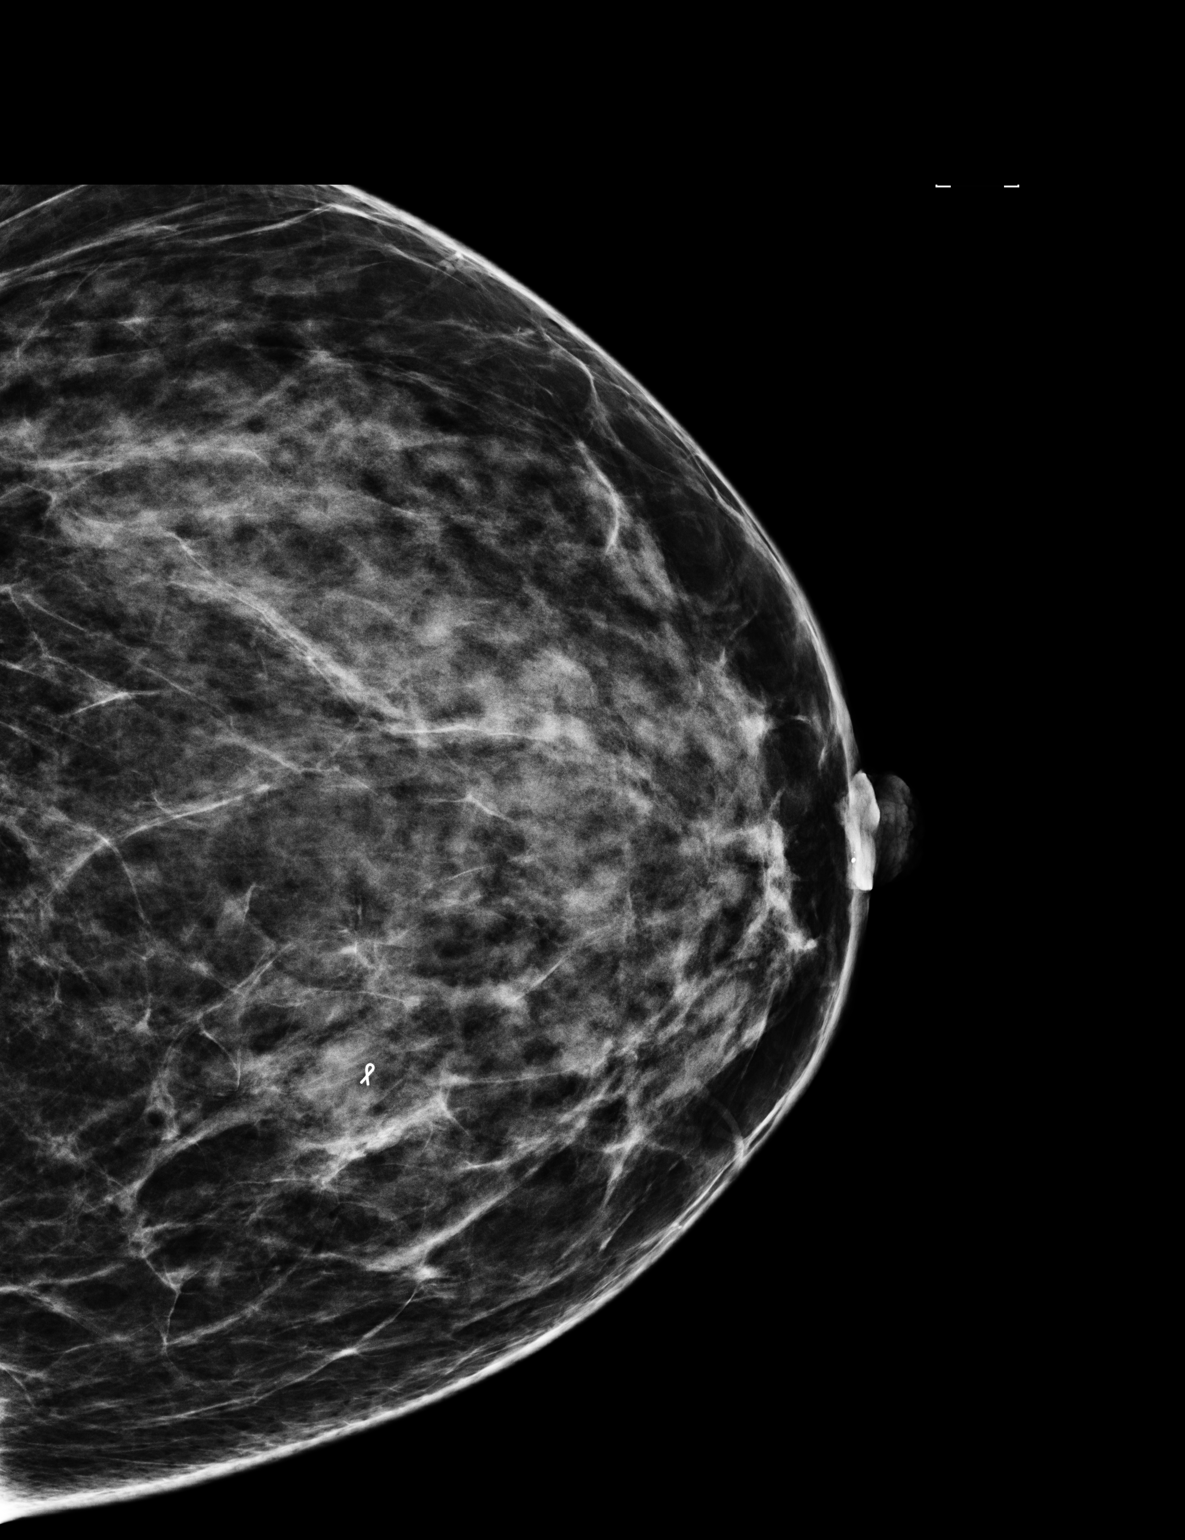

[2 of 2 positions shown; findings below may reference images not displayed]

FINDINGS: Mammographic images were obtained following left breast
ultrasound guided biopsy of hypoechoic mass at 11 o'clock 5 cm from
nipple.  CC and lateral view of the left breast demonstrate ribbon
biopsy clip in the mass of concern..
IMPRESSION: Post ultrasound guided core biopsy mammogram demonstrating biopsy
clip in the mass of concern.

Final Assessment:  Post Procedure Mammograms for Marker Placement

## 2014-10-03 IMAGING — US US CORE
1 series · 13 of 13 positions shown · non-contrast
Comparison: Previous exams.

***ADDENDUM*** CREATED: 05/01/2013 [DATE]

The pathology revealed fibroadenoma.  This is found to be
concordant with imaging findings.  I discussed the results over the
phone with the patient and answered her questions.  The patient
states she is doing well post biopsy without complications.
Recommend routine screening mammogram at age 38.
***END ADDENDUM*** SIGNED BY: Redeh Mirsafy, M.D.
CLINICAL DATA: Mass in left breast for biopsy
ULTRASOUND GUIDED CORE BIOPSY OF THE left BREAST

[Series 2: us core · 13 of 13 slices shown]
[im 1/13]
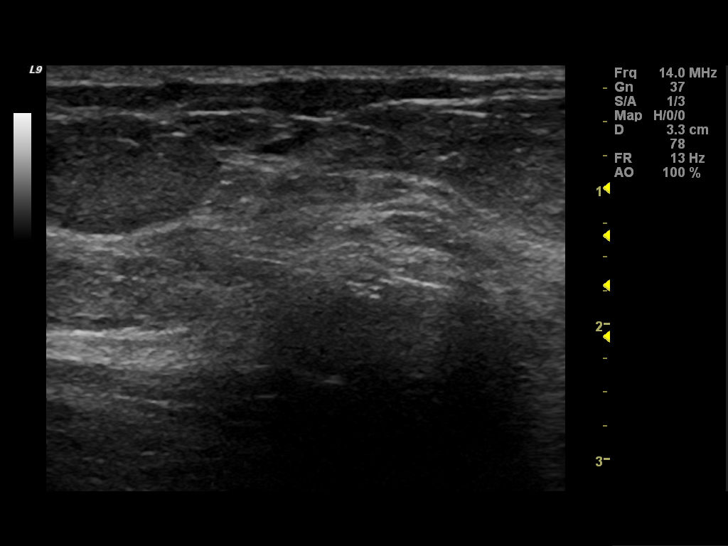
[im 2/13]
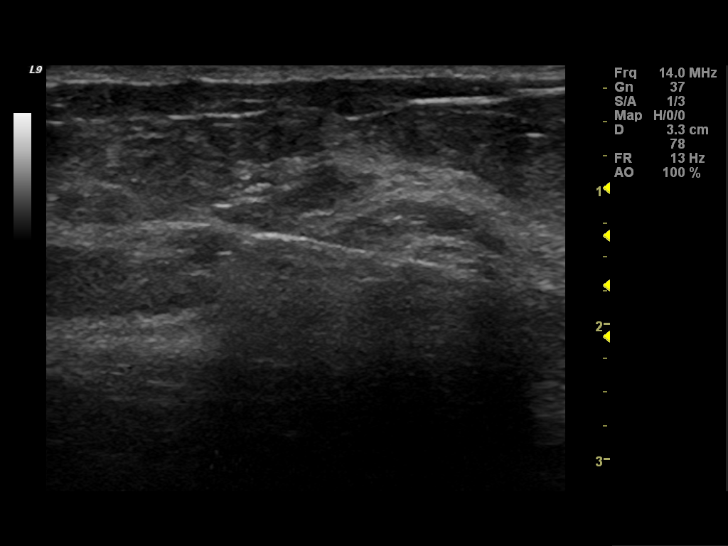
[im 3/13]
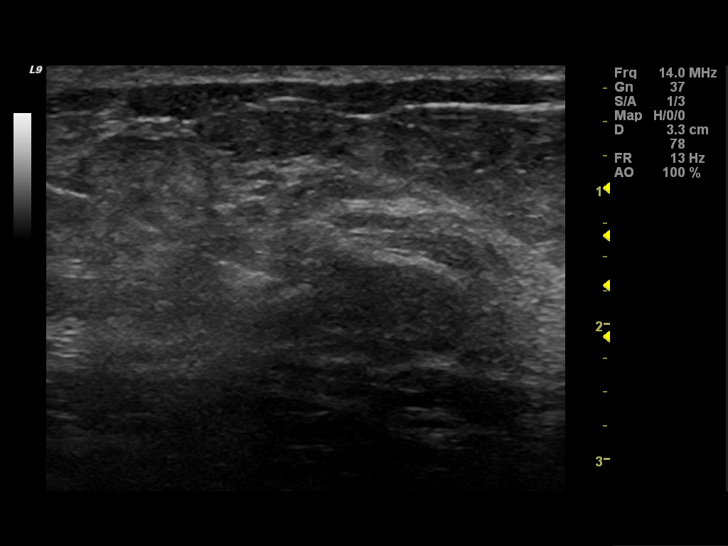
[im 4/13]
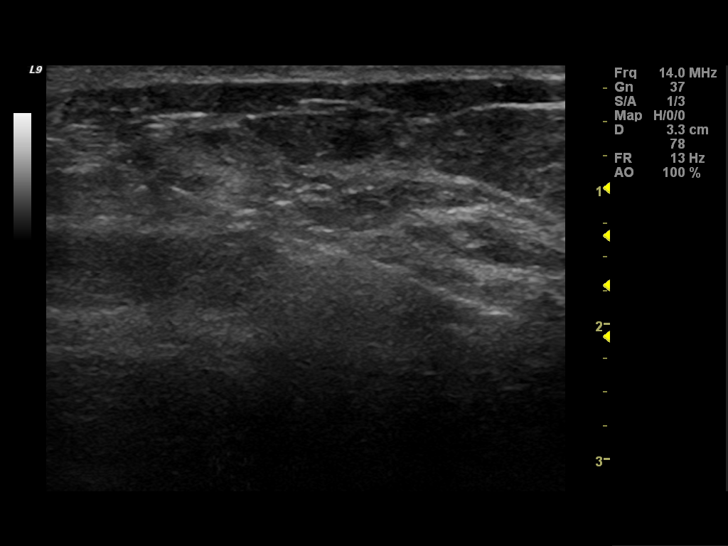
[im 5/13]
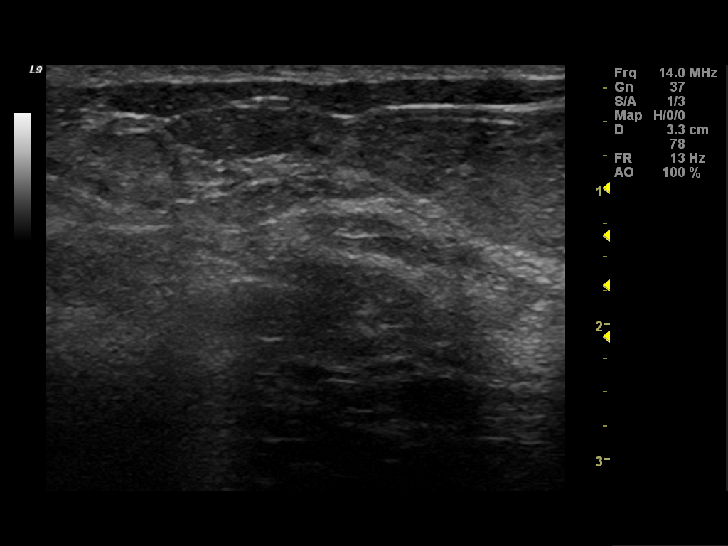
[im 6/13]
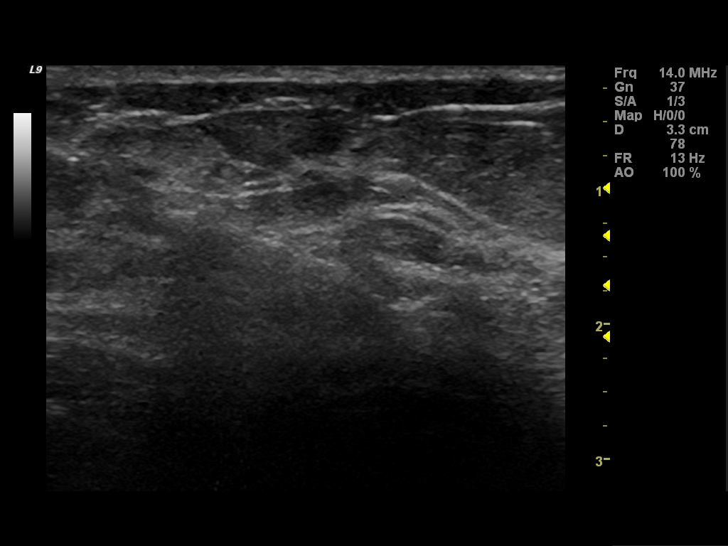
[im 7/13]
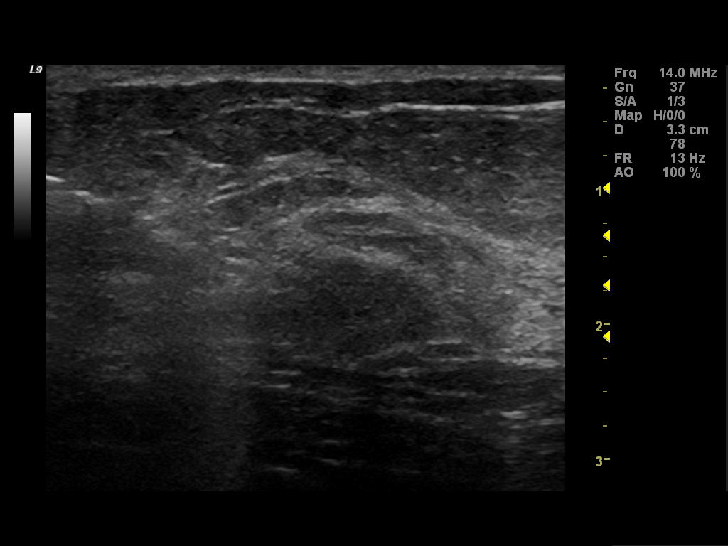
[im 8/13]
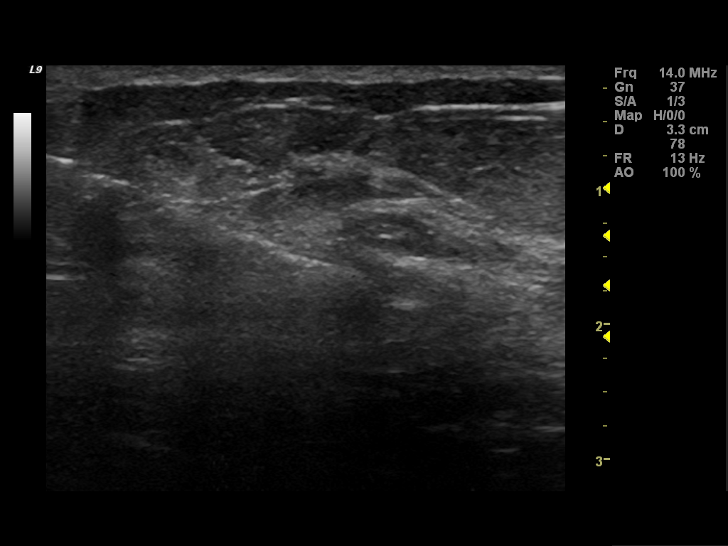
[im 9/13]
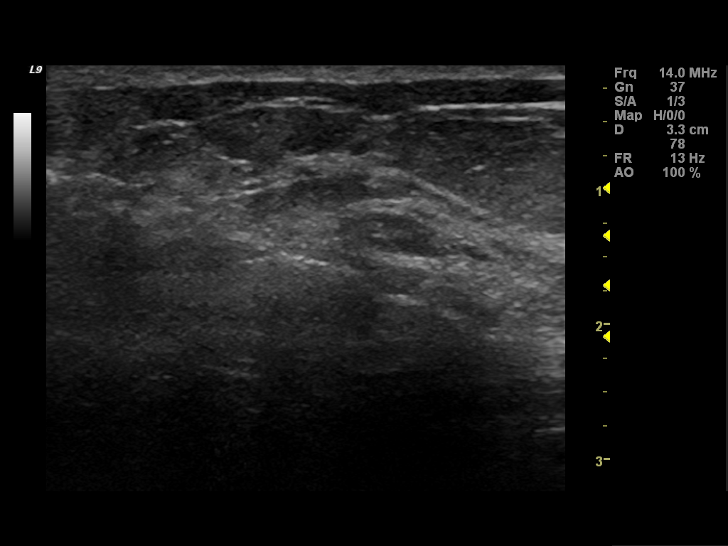
[im 10/13]
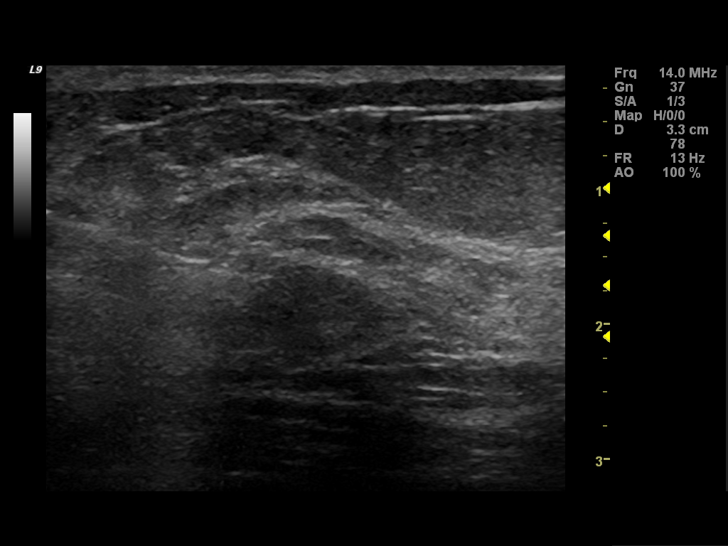
[im 11/13]
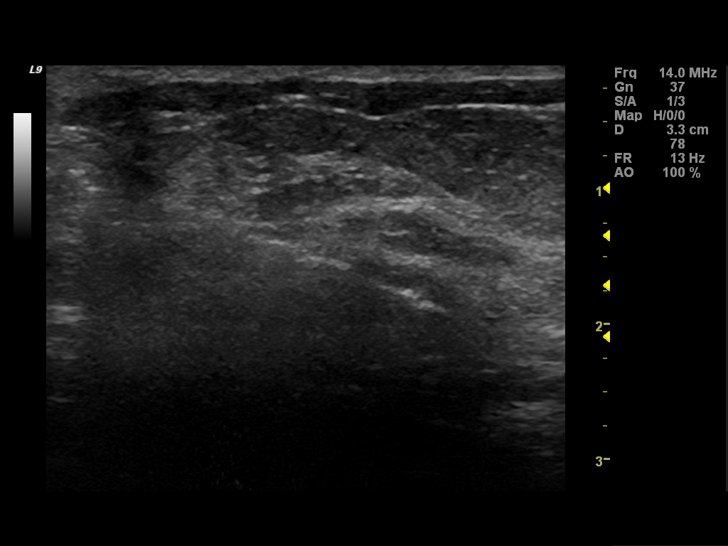
[im 12/13]
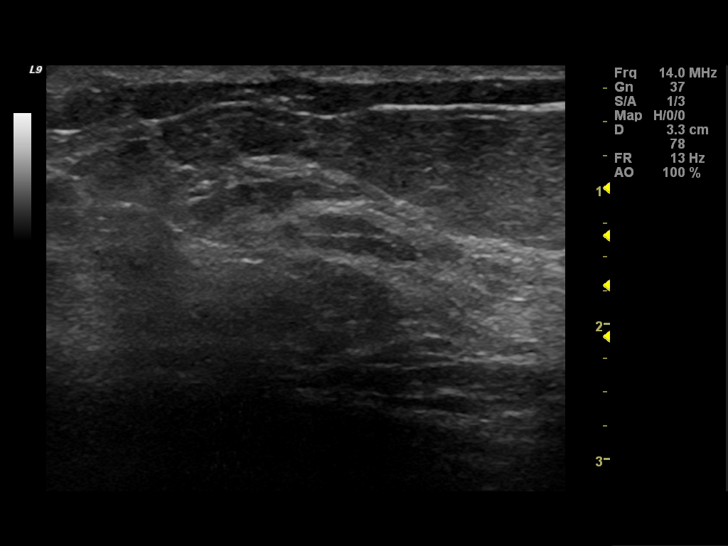
[im 13/13]
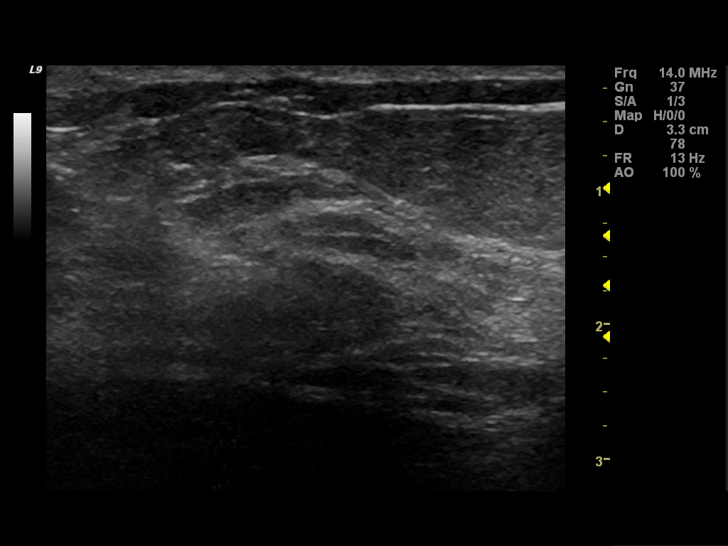

[13 of 13 positions shown; findings below may reference images not displayed]

I met with the patient and we discussed the procedure of ultrasound-
guided biopsy, including benefits and alternatives.  We discussed
the high likelihood of a successful procedure. We discussed the
risks of the procedure, including infection, bleeding, tissue
injury, clip migration, and inadequate sampling.  Informed written
consent was given. The usual time-out protocol was performed
immediately prior to the procedure.

Using sterile technique and 2% Lidocaine as local anesthetic, under
direct ultrasound visualization, a 12 gauge  vacuum-assisted device
was used to perform biopsy of mass in the left breast 11 o'clock 5
cm from nipple using a medial approach.

At the conclusion of the procedure a ribbon tissue marker clip was
deployed into the biopsy cavity.  Follow up 2 view mammogram was
performed and dictated separately.
IMPRESSION: Ultrasound guided biopsy of left breast.  No apparent
complications.

## 2015-10-27 IMAGING — US US ABDOMEN COMPLETE
1 series · 14 of 25 positions shown · non-contrast
Comparison: None.

CLINICAL DATA: Abnormal liver enzymes.

EXAM:
ULTRASOUND ABDOMEN COMPLETE

[Series 1: us abdomen complete · 0.26mm/px · 14 of 73 slices shown]
[im 1/73]
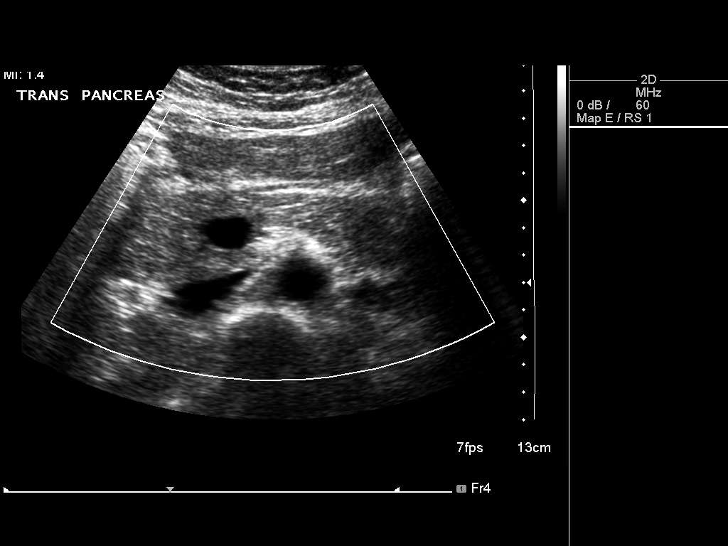
[im 7/73]
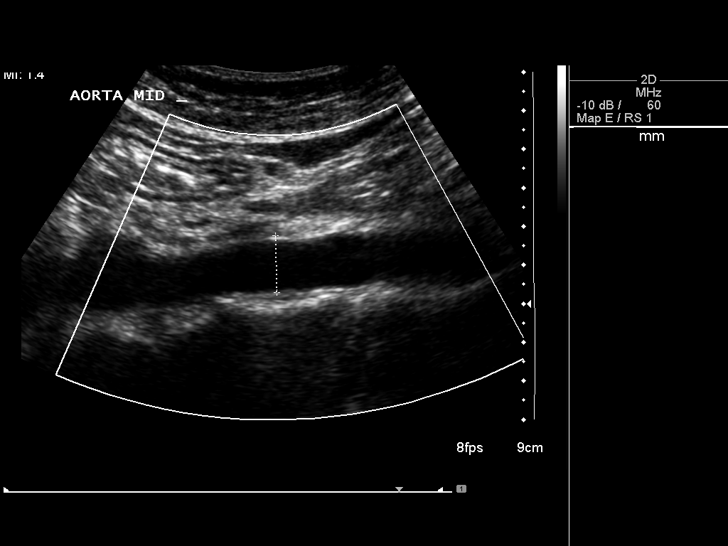
[im 13/73]
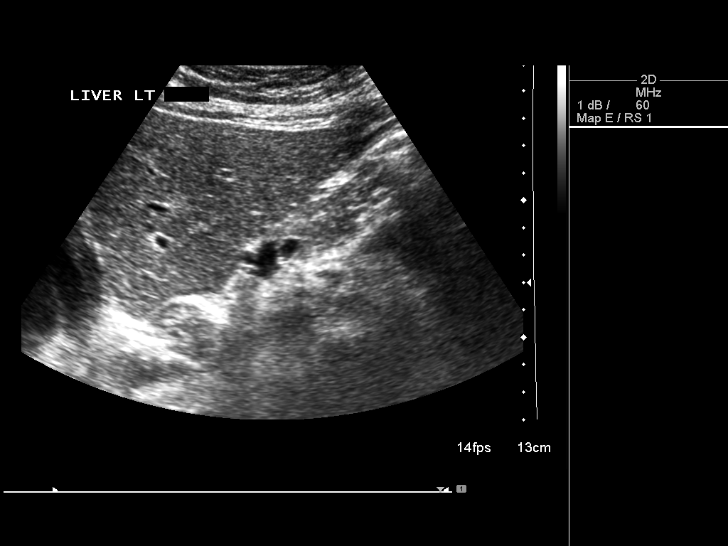
[im 19/73]
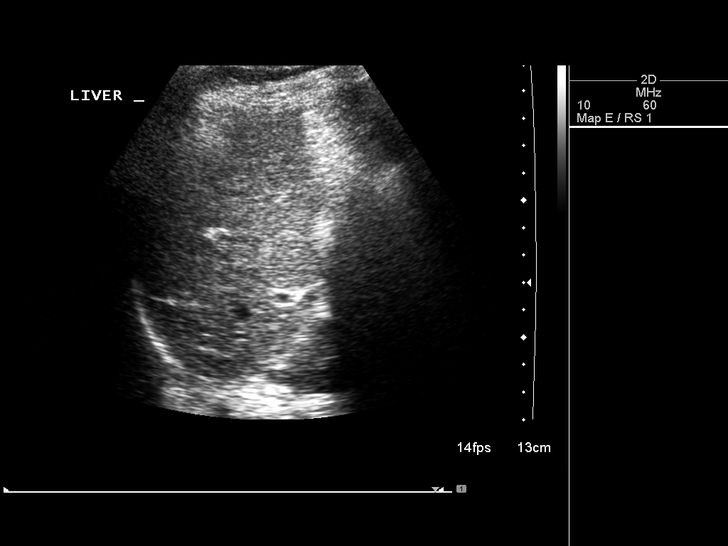
[im 25/73]
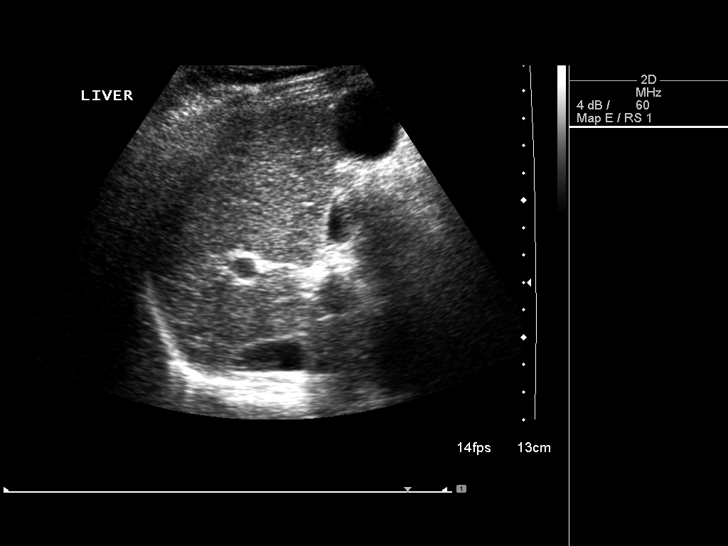
[im 28/73]
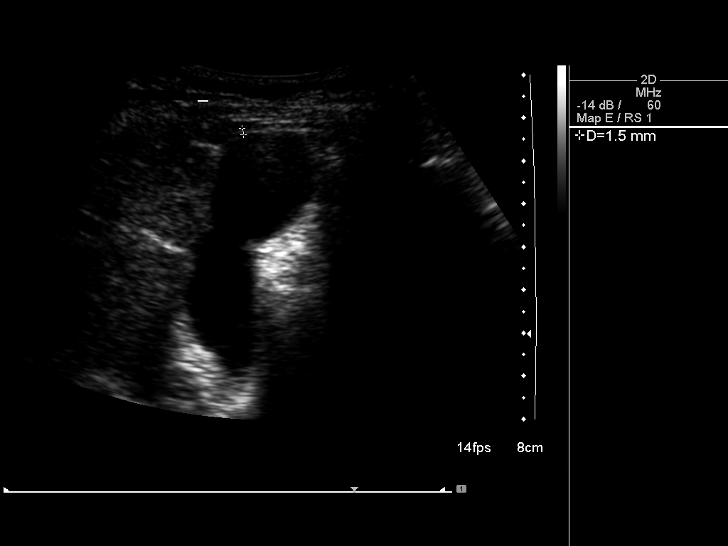
[im 34/73]
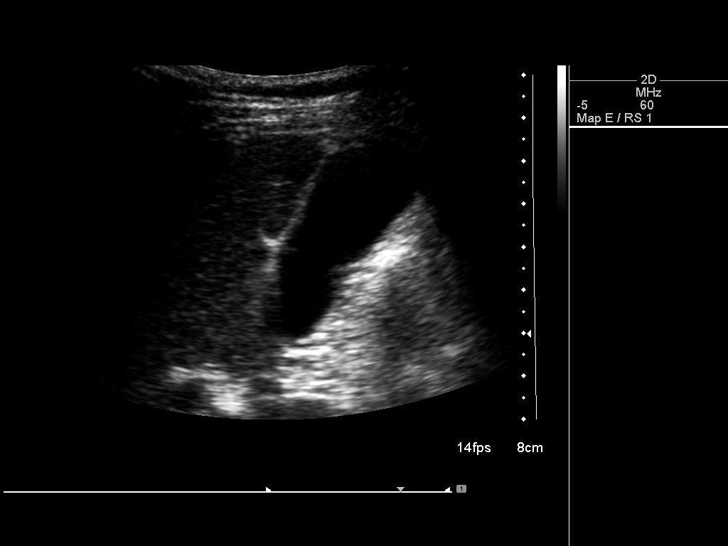
[im 40/73]
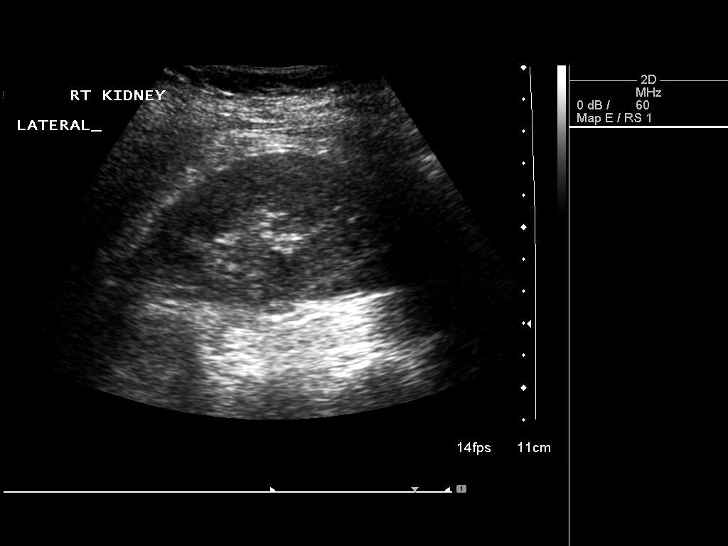
[im 46/73]
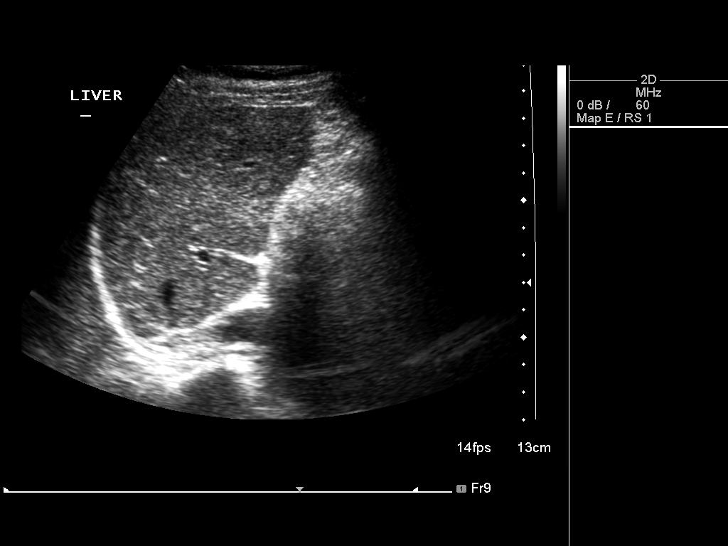
[im 49/73]
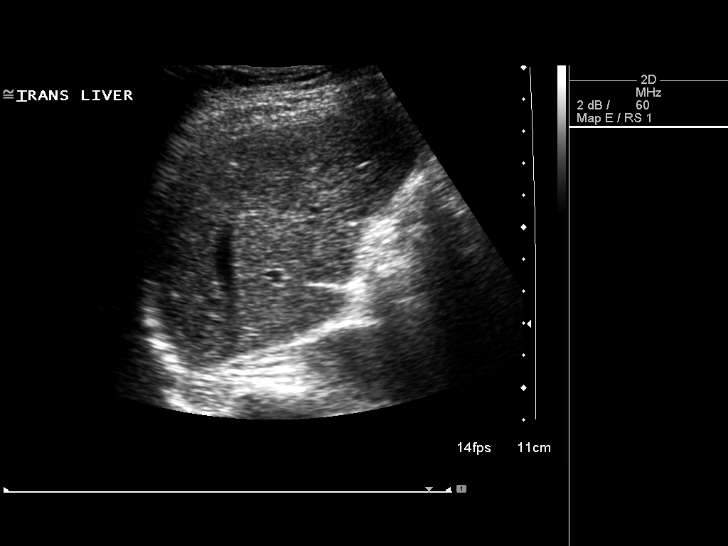
[im 55/73]
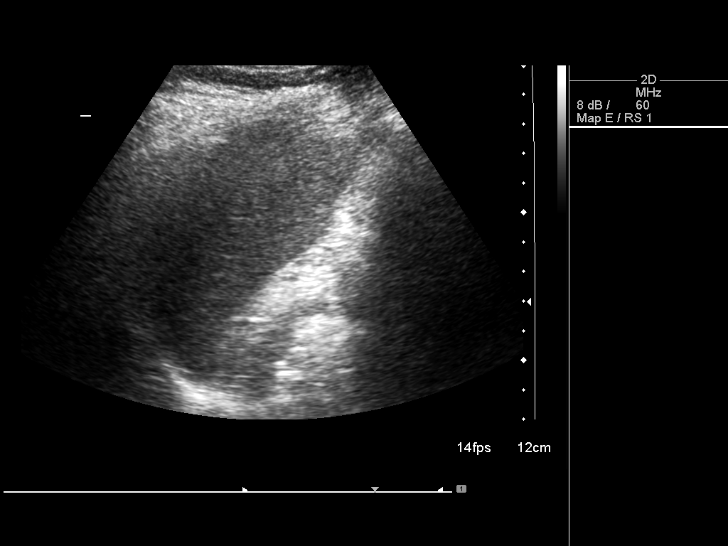
[im 61/73]
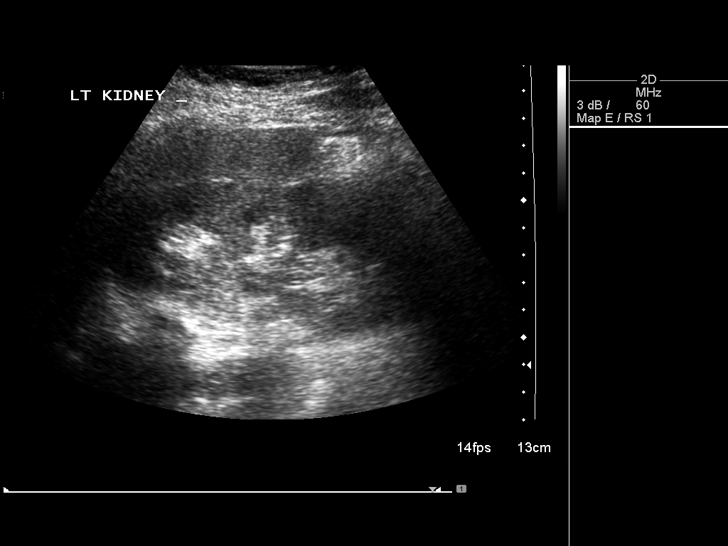
[im 67/73]
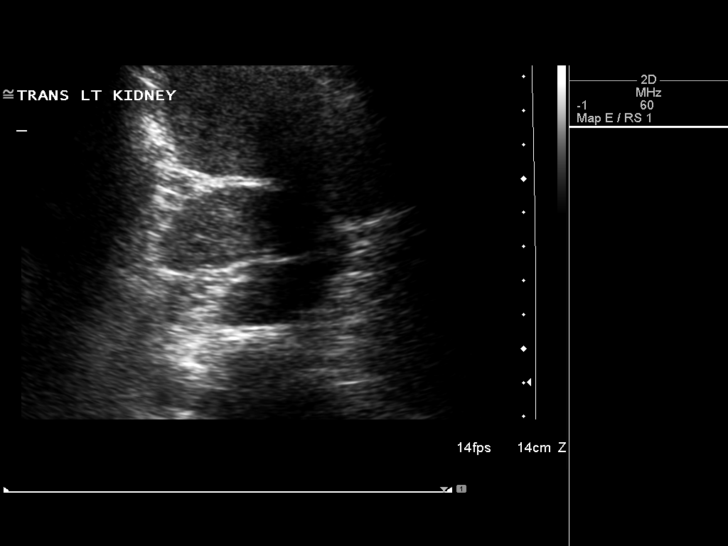
[im 73/73]
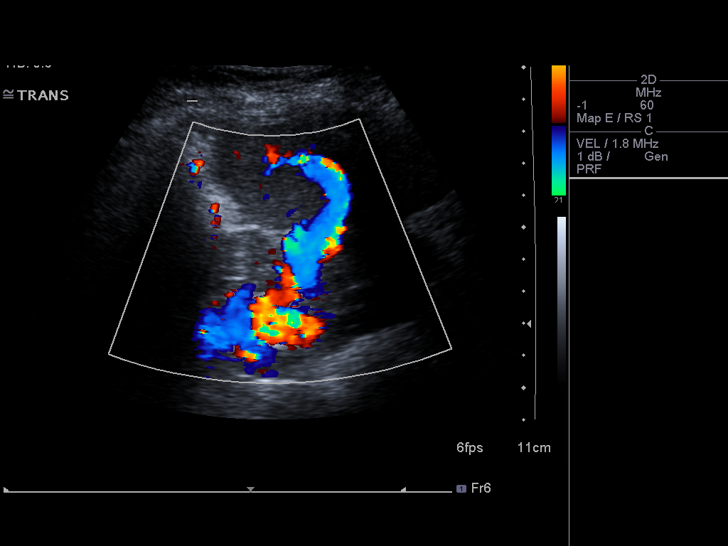

[14 of 25 positions shown; findings below may reference images not displayed]

FINDINGS: Gallbladder:

No gallstones or wall thickening visualized. No sonographic Murphy
sign noted.

Common bile duct:

Diameter: 2.0 mm, normal.

Liver:

No focal lesion identified. Within normal limits in parenchymal
echogenicity.

IVC:

Normal.

Pancreas:

Normal.

Spleen:

7.8 cm in length.  2.7 cm accessory spleen adjacent to the hilum.

Right Kidney:

Length: 9.6 cm. Echogenicity within normal limits. No mass or
hydronephrosis visualized.

Left Kidney:

Length: 12.0 cm. Echogenicity within normal limits. No mass or
hydronephrosis visualized.

Abdominal aorta:

Normal.  1.9 cm maximal diameter.

Other findings:

None.
IMPRESSION: Normal exam.

## 2019-04-12 ENCOUNTER — Other Ambulatory Visit: Payer: Self-pay | Admitting: Gastroenterology

## 2019-04-12 DIAGNOSIS — R7989 Other specified abnormal findings of blood chemistry: Secondary | ICD-10-CM

## 2019-04-12 DIAGNOSIS — R945 Abnormal results of liver function studies: Secondary | ICD-10-CM

## 2019-04-20 ENCOUNTER — Ambulatory Visit
Admission: RE | Admit: 2019-04-20 | Discharge: 2019-04-20 | Disposition: A | Discharge status: Home / Self Care | Payer: BC Managed Care – PPO | Source: Ambulatory Visit | Attending: Gastroenterology | Admitting: Gastroenterology

## 2019-04-20 DIAGNOSIS — R945 Abnormal results of liver function studies: Secondary | ICD-10-CM

## 2019-04-20 DIAGNOSIS — R7989 Other specified abnormal findings of blood chemistry: Secondary | ICD-10-CM

## 2019-11-03 ENCOUNTER — Ambulatory Visit: Payer: BC Managed Care – PPO | Attending: Internal Medicine

## 2019-11-03 DIAGNOSIS — Z23 Encounter for immunization: Secondary | ICD-10-CM | POA: Insufficient documentation

## 2019-11-03 NOTE — Progress Notes (Signed)
   Covid-19 Vaccination Clinic  Name:  [name]    MRN: [id number] DOB: July 27, 1981  11/03/2019  [name] was observed post Covid-19 immunization for 15 minutes without incidence. She was provided with Vaccine Information Sheet and instruction to access the V-Safe system.   [name] was instructed to call 911 with any severe reactions post vaccine: Marland Kitchen Difficulty breathing  . Swelling of your face and throat  . A fast heartbeat  . A bad rash all over your body  . Dizziness and weakness    Immunizations Administered    Name Date Dose VIS Date Route   Pfizer COVID-19 Vaccine 11/03/2019  2:19 PM 0.3 mL 08/17/2019 Intramuscular   Manufacturer: [employer], [employer]   Lot: [id number]   NDC: [address]-0

## 2019-11-24 ENCOUNTER — Ambulatory Visit: Payer: BC Managed Care – PPO

## 2019-11-27 ENCOUNTER — Ambulatory Visit: Payer: BC Managed Care – PPO | Attending: Internal Medicine

## 2019-11-27 DIAGNOSIS — Z23 Encounter for immunization: Secondary | ICD-10-CM

## 2019-11-27 NOTE — Progress Notes (Signed)
   [facility] Vaccination [facility]  Name:  [name]    MRN: [id number] DOB: 05/08/1981  11/27/2019  [name] was observed post Covid-19 immunization for 15 minutes without incident. She was provided with Vaccine Information Sheet and instruction to access the V-Safe system.   [name] was instructed to call 911 with any severe reactions post vaccine: Marland Kitchen Difficulty breathing  . Swelling of face and throat  . A fast heartbeat  . A bad rash all over body  . Dizziness and weakness   Immunizations Administered    Name Date Dose VIS Date Route   Pfizer COVID-19 Vaccine 11/27/2019  8:35 AM 0.3 mL 08/17/2019 Intramuscular   Manufacturer: [employer], [employer]   Lot: [id number]   NDC: [address]-5

## 2023-12-06 ENCOUNTER — Other Ambulatory Visit: Payer: Self-pay | Admitting: Obstetrics and Gynecology

## 2023-12-06 DIAGNOSIS — Z1231 Encounter for screening mammogram for malignant neoplasm of breast: Secondary | ICD-10-CM

## 2024-01-27 ENCOUNTER — Ambulatory Visit

## 2024-01-27 ENCOUNTER — Ambulatory Visit
Admission: RE | Admit: 2024-01-27 | Discharge: 2024-01-27 | Disposition: A | Discharge status: Home / Self Care | Source: Ambulatory Visit | Attending: Obstetrics and Gynecology | Admitting: Obstetrics and Gynecology

## 2024-01-27 ENCOUNTER — Ambulatory Visit: Payer: Self-pay

## 2024-01-27 DIAGNOSIS — Z1231 Encounter for screening mammogram for malignant neoplasm of breast: Secondary | ICD-10-CM
